# Patient Record
Sex: Female | Born: 1960
Health system: Southern US, Community
[De-identification: ages and names within clinical notes are randomized; demographics above are authoritative.]

## PROBLEM LIST (undated history)

## (undated) DIAGNOSIS — E669 Obesity, unspecified: Secondary | ICD-10-CM

## (undated) DIAGNOSIS — M109 Gout, unspecified: Secondary | ICD-10-CM

## (undated) DIAGNOSIS — H539 Unspecified visual disturbance: Secondary | ICD-10-CM

## (undated) DIAGNOSIS — I499 Cardiac arrhythmia, unspecified: Secondary | ICD-10-CM

## (undated) DIAGNOSIS — G43909 Migraine, unspecified, not intractable, without status migrainosus: Secondary | ICD-10-CM

## (undated) DIAGNOSIS — J309 Allergic rhinitis, unspecified: Secondary | ICD-10-CM

## (undated) DIAGNOSIS — R202 Paresthesia of skin: Secondary | ICD-10-CM

## (undated) DIAGNOSIS — I10 Essential (primary) hypertension: Secondary | ICD-10-CM

## (undated) DIAGNOSIS — N3090 Cystitis, unspecified without hematuria: Secondary | ICD-10-CM

## (undated) DIAGNOSIS — D72819 Decreased white blood cell count, unspecified: Secondary | ICD-10-CM

## (undated) DIAGNOSIS — D649 Anemia, unspecified: Secondary | ICD-10-CM

## (undated) DIAGNOSIS — R200 Anesthesia of skin: Secondary | ICD-10-CM

## (undated) DIAGNOSIS — N920 Excessive and frequent menstruation with regular cycle: Secondary | ICD-10-CM

## (undated) HISTORY — DX: Excessive and frequent menstruation with regular cycle: N92.0

## (undated) HISTORY — DX: Allergic rhinitis, unspecified: J30.9

## (undated) HISTORY — DX: Essential (primary) hypertension: I10

## (undated) HISTORY — PX: ROBOTIC ASSISTED LAPAROSCOPIC VAGINAL HYSTERECTOMY WITH FIBROID REMOVAL: SHX5387

## (undated) HISTORY — DX: Anemia, unspecified: D64.9

## (undated) HISTORY — DX: Anesthesia of skin: R20.0

## (undated) HISTORY — DX: Paresthesia of skin: R20.2

## (undated) HISTORY — PX: TUBAL LIGATION: SHX77

## (undated) HISTORY — DX: Obesity, unspecified: E66.9

## (undated) HISTORY — DX: Unspecified visual disturbance: H53.9

## (undated) HISTORY — PX: CARPAL TUNNEL RELEASE: SHX101

## (undated) HISTORY — DX: Decreased white blood cell count, unspecified: D72.819

## (undated) HISTORY — DX: Cardiac arrhythmia, unspecified: I49.9

## (undated) HISTORY — DX: Migraine, unspecified, not intractable, without status migrainosus: G43.909

## (undated) HISTORY — DX: Gout, unspecified: M10.9

## (undated) HISTORY — DX: Cystitis, unspecified without hematuria: N30.90

---

## 1998-07-23 ENCOUNTER — Emergency Department (HOSPITAL_COMMUNITY): Admission: EM | Admit: 1998-07-23 | Discharge: 1998-07-23 | Payer: Self-pay | Admitting: Emergency Medicine

## 1998-12-05 ENCOUNTER — Other Ambulatory Visit: Admission: RE | Admit: 1998-12-05 | Discharge: 1998-12-05 | Payer: Self-pay | Admitting: *Deleted

## 1999-02-24 ENCOUNTER — Ambulatory Visit (HOSPITAL_COMMUNITY): Admission: RE | Admit: 1999-02-24 | Discharge: 1999-02-24 | Payer: Self-pay | Admitting: *Deleted

## 1999-03-03 ENCOUNTER — Encounter (INDEPENDENT_AMBULATORY_CARE_PROVIDER_SITE_OTHER): Payer: Self-pay | Admitting: *Deleted

## 1999-03-03 ENCOUNTER — Ambulatory Visit (HOSPITAL_COMMUNITY): Admission: RE | Admit: 1999-03-03 | Discharge: 1999-03-03 | Payer: Self-pay | Admitting: *Deleted

## 1999-04-05 ENCOUNTER — Other Ambulatory Visit: Admission: RE | Admit: 1999-04-05 | Discharge: 1999-04-05 | Payer: Self-pay | Admitting: *Deleted

## 2001-01-27 ENCOUNTER — Other Ambulatory Visit: Admission: RE | Admit: 2001-01-27 | Discharge: 2001-01-27 | Payer: Self-pay | Admitting: *Deleted

## 2001-08-01 ENCOUNTER — Emergency Department (HOSPITAL_COMMUNITY): Admission: EM | Admit: 2001-08-01 | Discharge: 2001-08-01 | Payer: Self-pay | Admitting: Emergency Medicine

## 2001-08-01 ENCOUNTER — Encounter: Payer: Self-pay | Admitting: Emergency Medicine

## 2001-08-02 ENCOUNTER — Emergency Department (HOSPITAL_COMMUNITY): Admission: EM | Admit: 2001-08-02 | Discharge: 2001-08-03 | Payer: Self-pay | Admitting: *Deleted

## 2001-08-07 ENCOUNTER — Ambulatory Visit (HOSPITAL_COMMUNITY): Admission: RE | Admit: 2001-08-07 | Discharge: 2001-08-07 | Payer: Self-pay | Admitting: *Deleted

## 2001-11-12 ENCOUNTER — Other Ambulatory Visit: Admission: RE | Admit: 2001-11-12 | Discharge: 2001-11-12 | Payer: Self-pay | Admitting: *Deleted

## 2002-11-27 ENCOUNTER — Other Ambulatory Visit: Admission: RE | Admit: 2002-11-27 | Discharge: 2002-11-27 | Payer: Self-pay | Admitting: Obstetrics & Gynecology

## 2003-12-21 ENCOUNTER — Ambulatory Visit (HOSPITAL_COMMUNITY): Admission: RE | Admit: 2003-12-21 | Discharge: 2003-12-21 | Payer: Self-pay | Admitting: Obstetrics and Gynecology

## 2003-12-21 ENCOUNTER — Other Ambulatory Visit: Admission: RE | Admit: 2003-12-21 | Discharge: 2003-12-21 | Payer: Self-pay | Admitting: Obstetrics & Gynecology

## 2003-12-30 ENCOUNTER — Encounter: Admission: RE | Admit: 2003-12-30 | Discharge: 2003-12-30 | Payer: Self-pay | Admitting: Obstetrics and Gynecology

## 2004-07-07 ENCOUNTER — Encounter: Admission: RE | Admit: 2004-07-07 | Discharge: 2004-07-07 | Payer: Self-pay | Admitting: Emergency Medicine

## 2005-09-26 ENCOUNTER — Inpatient Hospital Stay (HOSPITAL_COMMUNITY): Admission: RE | Admit: 2005-09-26 | Discharge: 2005-09-28 | Payer: Self-pay | Admitting: Obstetrics & Gynecology

## 2005-09-26 ENCOUNTER — Encounter (INDEPENDENT_AMBULATORY_CARE_PROVIDER_SITE_OTHER): Payer: Self-pay | Admitting: Specialist

## 2006-04-10 ENCOUNTER — Ambulatory Visit (HOSPITAL_COMMUNITY): Admission: RE | Admit: 2006-04-10 | Discharge: 2006-04-10 | Payer: Self-pay | Admitting: Obstetrics & Gynecology

## 2007-04-18 ENCOUNTER — Ambulatory Visit (HOSPITAL_COMMUNITY): Admission: RE | Admit: 2007-04-18 | Discharge: 2007-04-18 | Payer: Self-pay | Admitting: Obstetrics & Gynecology

## 2009-07-26 ENCOUNTER — Ambulatory Visit (HOSPITAL_COMMUNITY): Admission: RE | Admit: 2009-07-26 | Discharge: 2009-07-26 | Payer: Self-pay | Admitting: Obstetrics & Gynecology

## 2009-09-29 ENCOUNTER — Encounter: Admission: RE | Admit: 2009-09-29 | Discharge: 2009-09-29 | Payer: Self-pay | Admitting: Family Medicine

## 2010-08-23 ENCOUNTER — Ambulatory Visit (HOSPITAL_COMMUNITY): Admission: RE | Admit: 2010-08-23 | Payer: Self-pay | Source: Home / Self Care | Admitting: Obstetrics & Gynecology

## 2010-09-17 ENCOUNTER — Encounter: Payer: Self-pay | Admitting: Otolaryngology

## 2010-09-17 ENCOUNTER — Encounter: Payer: Self-pay | Admitting: Obstetrics & Gynecology

## 2010-09-18 ENCOUNTER — Other Ambulatory Visit (HOSPITAL_COMMUNITY): Payer: Self-pay | Admitting: Obstetrics & Gynecology

## 2010-09-18 DIAGNOSIS — Z139 Encounter for screening, unspecified: Secondary | ICD-10-CM

## 2010-09-27 ENCOUNTER — Ambulatory Visit (HOSPITAL_COMMUNITY)
Admission: RE | Admit: 2010-09-27 | Discharge: 2010-09-27 | Disposition: A | Discharge status: Home / Self Care | Payer: Self-pay | Source: Ambulatory Visit | Attending: Obstetrics & Gynecology | Admitting: Obstetrics & Gynecology

## 2010-09-27 ENCOUNTER — Ambulatory Visit (HOSPITAL_COMMUNITY)
Admission: RE | Admit: 2010-09-27 | Discharge: 2010-09-27 | Disposition: A | Discharge status: Home / Self Care | Payer: Self-pay | Source: Ambulatory Visit

## 2010-09-27 DIAGNOSIS — Z1231 Encounter for screening mammogram for malignant neoplasm of breast: Secondary | ICD-10-CM

## 2010-09-27 DIAGNOSIS — Z1239 Encounter for other screening for malignant neoplasm of breast: Secondary | ICD-10-CM

## 2010-09-27 DIAGNOSIS — Z139 Encounter for screening, unspecified: Secondary | ICD-10-CM

## 2010-09-28 ENCOUNTER — Ambulatory Visit (HOSPITAL_COMMUNITY): Payer: Self-pay

## 2010-09-28 ENCOUNTER — Other Ambulatory Visit (HOSPITAL_COMMUNITY): Payer: Self-pay | Admitting: Obstetrics & Gynecology

## 2010-09-28 DIAGNOSIS — Z1239 Encounter for other screening for malignant neoplasm of breast: Secondary | ICD-10-CM

## 2010-10-13 ENCOUNTER — Other Ambulatory Visit: Payer: Self-pay | Admitting: Dermatology

## 2010-10-27 ENCOUNTER — Other Ambulatory Visit: Payer: Self-pay | Admitting: Gastroenterology

## 2011-01-12 NOTE — Op Note (Signed)
Danielle Griffin, ELEM               ACCOUNT NO.:  [id number]   MEDICAL RECORD NO.:  [id number]          PATIENT TYPE:  INP   LOCATION:  9399                          FACILITY:  [facility]   PHYSICIAN:  Genia Del, M.D.DATE OF BIRTH:  05-04-1961   DATE OF PROCEDURE:  09/26/2005  DATE OF DISCHARGE:                                 OPERATIVE REPORT   PREOPERATIVE DIAGNOSIS:  Symptomatic uterine myomas.   POSTOPERATIVE DIAGNOSES:  Symptomatic uterine myomas.   INTERVENTION:  Multiple myomectomies.   SURGEON:  Genia Del, M.D.   ASSISTANT:  Lenoard Aden, M.D.   ANESTHESIA:  Dr. Jean Rosenthal.   PROCEDURE:  Under general anesthesia with endotracheal intubation, the  patient is in decubitus dorsal position.  She was prepped with Betadine on  the abdominal, suprapubic, vulvar and vaginal areas.  A bladder catheter is  inserted and the patient was draped as usual.  A Pfannenstiel incision is  done with the scalpel after infiltrating the subcutaneous tissue with  Marcaine 0.25% 10 mL.  We then open the adipose tissue with the  electrocautery and the aponeurosis with the electrocautery and Mayo  scissors. The recti muscles are separated on the midline from the  aponeurosis.  The parietal peritoneum is opened longitudinally with  Metzenbaum scissors.  We exteriorize the uterus at the skin.  It is  voluminous with multiple uterine myomas.  Some are subserosal, others are  intramural.  They are throughout the uterus anteriorly and posteriorly.  We  inspect the adnexa.  Both ovaries are normal in size and appearance, and  both tubes are normal except for a previous bilateral tubal sterilization.  We infiltrate the myometrium with Pitressin 20 in 60.  We put 10 mL  anteriorly and 10 mL posteriorly.  We start with an anterior longitudinal  incision with the electrocautery cutting mode and coagulation when  hemostasis is needed.  We remove many intramural fibroids with that  incision.  We  remove them using Metzenbaum scissors and electrocautery.  We  proceed the same way with a longitudinal posterior incision.  The total  uterine myomas removed is 36.  Those will also be weighed.  We then proceed  with closure of both incisions.  We use a Vicryl 0 in pursestring and X-  stitches to close the deep layer of the myometrium.  We then use a running  baseball stitch to close the serosa.  We proceed the same way with the  posterior incision.  We complete hemostasis at the serosa, where subserosal  fibroids were removed.  We put Interceed on both longitudinal incisions.  Hemostasis is adequate at all levels.  Note that the baseball stitches were  done with 2-0 Vicryl.  We suction the abdominal-pelvic cavities.  We verify  hemostasis on the recti abdominal muscles and complete it with the  electrocautery.  We then close the aponeurosis with two half  running sutures of Vicryl 0, complete hemostasis on the adipose tissue with  the electrocautery and close the skin with staples.  A dry dressing is  applied.  The estimated blood loss was 600 mL,  no complication occurred, and  the patient was brought to recovery room in good status.      Genia Del, M.D.  Electronically Signed     ML/MEDQ  D:  09/26/2005  T:  09/26/2005  Job:  [id number]

## 2011-01-12 NOTE — Discharge Summary (Signed)
Danielle Griffin, Danielle Griffin               ACCOUNT NO.:  [id number]   MEDICAL RECORD NO.:  [id number]          PATIENT TYPE:  INP   LOCATION:  9306                          FACILITY:  [facility]   PHYSICIAN:  Danielle Griffin, M.D.DATE OF BIRTH:  Apr 27, 1961   DATE OF ADMISSION:  09/26/2005  DATE OF DISCHARGE:  09/28/2005                                 DISCHARGE SUMMARY   ADMISSION DIAGNOSIS:  Symptomatic uterine myoma.   DISCHARGE DIAGNOSES:  1.  Symptomatic uterine myoma.  2.  Severe postop anemia.   INTERVENTION:  Myomectomy by laparotomy.   SURGEON:  Dr. Genia Griffin.   ASSISTANT:  Dr. Ocie Cornfield.   ANESTHESIOLOGIST:  Dr. Algie Coffer.   HOSPITAL COURSE:  The patient was admitted on the day of surgery.  The  surgery was uneventful.  The estimated blood loss was 600 mL.  Multiple  myomas were removed, about 34 of them.  Those were intramural and subserosal  myomas.  The postop evolution was good.  The patient remained  hemodynamically stable.  However, she lowered her hemoglobin to 6.2 and then  remained stable at that level.  Her last hemoglobin before discharge was  6.3.  She was discharged in stable status.  She was prescribed Chromagen  forte.  She was also advised a high-iron diet and she will be followed up at  the office with a hemoglobin and removal of the staples on February 5.  Postop advice was given.      Danielle Griffin, M.D.  Electronically Signed     ML/MEDQ  D:  10/18/2005  T:  10/18/2005  Job:  [id number]

## 2012-02-01 ENCOUNTER — Other Ambulatory Visit: Payer: Self-pay | Admitting: Gastroenterology

## 2013-10-01 ENCOUNTER — Encounter: Payer: Self-pay | Admitting: Cardiology

## 2013-10-01 DIAGNOSIS — I10 Essential (primary) hypertension: Secondary | ICD-10-CM

## 2013-10-01 DIAGNOSIS — J309 Allergic rhinitis, unspecified: Secondary | ICD-10-CM | POA: Insufficient documentation

## 2018-10-09 ENCOUNTER — Encounter: Payer: Self-pay | Admitting: *Deleted

## 2018-10-13 ENCOUNTER — Encounter: Payer: Self-pay | Admitting: Diagnostic Neuroimaging

## 2018-10-13 ENCOUNTER — Ambulatory Visit (INDEPENDENT_AMBULATORY_CARE_PROVIDER_SITE_OTHER): Payer: BLUE CROSS/BLUE SHIELD | Admitting: Diagnostic Neuroimaging

## 2018-10-13 ENCOUNTER — Other Ambulatory Visit: Payer: Self-pay

## 2018-10-13 VITALS — BP 117/73 | HR 100 | Resp 18 | Ht 65.0 in | Wt 226.0 lb

## 2018-10-13 DIAGNOSIS — I7300 Raynaud's syndrome without gangrene: Secondary | ICD-10-CM

## 2018-10-13 NOTE — Progress Notes (Signed)
[facility]  PATIENT: Danielle Griffin DOB: Oct 18, 1960  REFERRING CLINICIAN: D Gates HISTORY FROM: patient  REASON FOR VISIT: new consult    HISTORICAL  CHIEF COMPLAINT:  Chief Complaint  Patient presents with  . Numbness    Rm. 7. Here alone for eval of throbbing pain, numbness, color change in all fingers, toes, in the late morning, afternoons only, around 10am-2pm. First noted about 6 mos. ago./fim    HISTORY OF PRESENT ILLNESS:   58 year old female here for leg numbness and tingling.  For past 6 months patient has intermittent color change in her fingertips, which turn white, followed by throbbing numbness pain sensation.  She tries to warm up her hands by rubbing them together, wearing gloves or other techniques which slightly helps.  Symptoms are worse between 10 AM and 2 PM when she is at work in a heavily air-conditioned area.  Symptoms are worse in the wintertime.  Symptoms can occur in warmer weather but are less severe.  Symptoms also affect her toes.  Currently patient does not have symptoms in our office.  No facial numbness or pain.  No gait or balance difficulty.   REVIEW OF SYSTEMS: Full 14 system review of systems performed and negative with exception of: Fevers chills weight gain swelling in legs snoring joint pain.   ALLERGIES: Allergies  Allergen Reactions  . Norvasc [Amlodipine Besylate] Other (See Comments)    drowsiness  . Penicillins     Nausea   . Tylox [Oxycodone-Acetaminophen]     Migraines    HOME MEDICATIONS: Outpatient Medications Prior to Visit  Medication Sig Dispense Refill  . albuterol (PROVENTIL HFA;VENTOLIN HFA) 108 (90 BASE) MCG/ACT inhaler Inhale 2 puffs into the lungs every 4 (four) hours as needed for wheezing or shortness of breath.    . allopurinol (ZYLOPRIM) 300 MG tablet Take 300 mg by mouth daily.    . cetirizine (ZYRTEC) 10 MG chewable tablet Chew 10 mg by mouth daily.    . cyclobenzaprine (FLEXERIL)  10 MG tablet Take 10 mg by mouth 3 (three) times daily as needed.    . fluticasone (FLONASE) 50 MCG/ACT nasal spray Place 2 sprays into both nostrils daily.    . hydrochlorothiazide (HYDRODIURIL) 25 MG tablet Take 25 mg by mouth daily.    . meloxicam (MOBIC) 15 MG tablet Take 15 mg by mouth daily. As needed    . montelukast (SINGULAIR) 10 MG tablet Take 10 mg by mouth at bedtime.    . Multiple Vitamin (MULTIVITAMIN) tablet Take 1 tablet by mouth daily.    Marland Kitchen olmesartan (BENICAR) 40 MG tablet Take 40 mg by mouth daily.     No facility-administered medications prior to visit.     PAST MEDICAL HISTORY: Past Medical History:  Diagnosis Date  . Abnormal heart rhythm   . Allergic rhinitis   . Anemia   . Cystitis   . Gout   . Hypertension   . Leukopenia   . Menorrhagia   . Migraine   . Numbness and tingling   . Obesity   . Vision abnormalities     PAST SURGICAL HISTORY: Past Surgical History:  Procedure Laterality Date  . CARPAL TUNNEL RELEASE    . ROBOTIC ASSISTED LAPAROSCOPIC VAGINAL HYSTERECTOMY WITH FIBROID REMOVAL    . TUBAL LIGATION      FAMILY HISTORY: Family History  Problem Relation Age of Onset  . Hypertension Mother   . Hyperlipidemia Mother   . Dementia Mother   . Esophageal  cancer Father   . Hyperlipidemia Sister   . Hypertension Sister   . Hypertension Brother   . Hypertension Brother   . Colon cancer Maternal Aunt   . Heart attack Maternal Grandfather     SOCIAL HISTORY: Social History   Socioeconomic History  . Marital status: Married    Spouse name: Jenaya Saar  . Number of children: 2  . Years of education: 15 yrs.   . Highest education level: High school graduate  Occupational History  . Occupation: Merchant navy officer    Comment: [employer]  Social Needs  . Financial resource strain: Not on file  . Food insecurity:    Worry: Not on file    Inability: Not on file  . Transportation needs:    Medical: Not on file     Non-medical: Not on file  Tobacco Use  . Smoking status: Never Smoker  . Smokeless tobacco: Never Used  Substance and Sexual Activity  . Alcohol use: Yes  . Drug use: No  . Sexual activity: Not on file  Lifestyle  . Physical activity:    Days per week: Not on file    Minutes per session: Not on file  . Stress: Not on file  Relationships  . Social connections:    Talks on phone: Not on file    Gets together: Not on file    Attends religious service: Not on file    Active member of club or organization: Not on file    Attends meetings of clubs or organizations: Not on file    Relationship status: Not on file  . Intimate partner violence:    Fear of current or ex partner: Not on file    Emotionally abused: Not on file    Physically abused: Not on file    Forced sexual activity: Not on file  Other Topics Concern  . Not on file  Social History Narrative  . Not on file     PHYSICAL EXAM  GENERAL EXAM/CONSTITUTIONAL: Vitals:  Vitals:   10/13/18 1033  BP: 117/73  Pulse: 100  Resp: 18  Weight: 226 lb (102.5 kg)  Height: 5\' 5"  (1.651 m)     Body mass index is 37.61 kg/m. Wt Readings from Last 3 Encounters:  10/13/18 226 lb (102.5 kg)     Patient is in no distress; well developed, nourished and groomed; neck is supple  CARDIOVASCULAR:  Examination of carotid arteries is normal; no carotid bruits  Regular rate and rhythm, no murmurs  Examination of peripheral vascular system by observation and palpation is normal  EYES:  Ophthalmoscopic exam of optic discs and posterior segments is normal; no papilledema or hemorrhages  No exam data present  MUSCULOSKELETAL:  Gait, strength, tone, movements noted in Neurologic exam below  NEUROLOGIC: MENTAL STATUS:  No flowsheet data found.  awake, alert, oriented to person, place and time  recent and remote memory intact  normal attention and concentration  language fluent, comprehension intact, naming  intact  fund of knowledge appropriate  CRANIAL NERVE:   2nd - no papilledema on fundoscopic exam  2nd, 3rd, 4th, 6th - pupils equal and reactive to light, visual fields full to confrontation, extraocular muscles intact, no nystagmus  5th - facial sensation symmetric  7th - facial strength symmetric  8th - hearing intact  9th - palate elevates symmetrically, uvula midline  11th - shoulder shrug symmetric  12th - tongue protrusion midline  MOTOR:   normal bulk and tone, full strength in the  BUE, BLE  SENSORY:   normal and symmetric to light touch, temperature, vibration  COORDINATION:   finger-nose-finger, fine finger movements normal  REFLEXES:   deep tendon reflexes TRACE and symmetric  GAIT/STATION:   narrow based gait     DIAGNOSTIC DATA (LABS, IMAGING, TESTING) - I reviewed patient records, labs, notes, testing and imaging myself where available.  No results found for: WBC, HGB, HCT, MCV, PLT No results found for: NA, K, CL, CO2, GLUCOSE, BUN, CREATININE, CALCIUM, PROT, ALBUMIN, AST, ALT, ALKPHOS, BILITOT, GFRNONAA, GFRAA No results found for: CHOL, HDL, LDLCALC, LDLDIRECT, TRIG, CHOLHDL No results found for: HGBA1C No results found for: VITAMINB12 No results found for: TSH  07/12/04 MRI brain [I reviewed images myself and agree with interpretation. -[facility]]  - Negative MRI of the brain without and with contrast.   ASSESSMENT AND PLAN  58 y.o. year old female here with cold sensitivity, color change in fingertips, numbness and pain in the fingertips, most consistent with Raynaud's phenomenon.  Unclear whether this is primary or secondary.  Recommend follow-up PCP and rheumatology for further evaluation.  No evidence of neuropathy at this time.  Dx:  1. Raynaud's phenomenon without gangrene     PLAN:  RAYNAUD'S PHENOMENON (painful, cold sensitive, color change to white) - avoid cold exposure - consider rheumatology evaluation (rule out secondary  causes) - consider calcium channel blockers (amlodipine or nifedipine)  Return for return to PCP, pending if symptoms worsen or fail to improve.    Penni Bombard, MD 4/46/1901, 22:24 AM Certified in Neurology, Neurophysiology and Neuroimaging  [facility] [address], [place], [place] [address] [phone]

## 2018-10-13 NOTE — Patient Instructions (Signed)
  RAYNAUD'S PHENOMENON (painful, cold sensitive, color change to white) - avoid cold exposure - consider rheumatology evaluation (rule out secondary causes) - consider calcium channel blockers (amlodipine or nifedipine)

## 2018-12-23 ENCOUNTER — Encounter: Payer: Self-pay | Admitting: Emergency Medicine

## 2018-12-23 ENCOUNTER — Other Ambulatory Visit: Payer: Self-pay

## 2018-12-23 ENCOUNTER — Ambulatory Visit
Admission: EM | Admit: 2018-12-23 | Discharge: 2018-12-23 | Disposition: A | Discharge status: Home / Self Care | Payer: BLUE CROSS/BLUE SHIELD | Attending: Physician Assistant | Admitting: Physician Assistant

## 2018-12-23 DIAGNOSIS — I10 Essential (primary) hypertension: Secondary | ICD-10-CM

## 2018-12-23 DIAGNOSIS — R1084 Generalized abdominal pain: Secondary | ICD-10-CM | POA: Diagnosis not present

## 2018-12-23 DIAGNOSIS — R112 Nausea with vomiting, unspecified: Secondary | ICD-10-CM

## 2018-12-23 MED ORDER — DICYCLOMINE HCL 20 MG PO TABS: 20.0000 mg | ORAL_TABLET | Freq: Two times a day (BID) | ORAL | 0 refills | Status: AC

## 2018-12-23 MED ORDER — ONDANSETRON 4 MG PO TBDP: 4.0000 mg | ORAL_TABLET | Freq: Three times a day (TID) | ORAL | 0 refills | Status: AC | PRN

## 2018-12-23 MED ORDER — ONDANSETRON 4 MG PO TBDP
4.0000 mg | ORAL_TABLET | Freq: Once | ORAL | Status: AC
  Administered 2018-12-23: 4 mg via ORAL

## 2018-12-23 NOTE — Discharge Instructions (Signed)
Zofran for nausea and vomiting as needed. Bentyl for abdominal cramping. Keep hydrated, you urine should be clear to pale yellow in color. Bland diet, advance as tolerated. Monitor for any worsening of symptoms, nausea or vomiting not controlled by medication, worsening abdominal pain, fever, follow-up for reevaluation. °

## 2018-12-23 NOTE — ED Notes (Signed)
Patient able to ambulate independently  

## 2018-12-23 NOTE — ED Provider Notes (Signed)
[name]    CSN: [id number] Arrival date & time: 12/23/18  1622     History   Chief Complaint Chief Complaint  Patient presents with  . Abdominal Pain    HPI LOLLY GLAUS is a 58 y.o. female.   58 year old female with history of HTN, comes in for 2-day history of nausea, vomiting, diarrhea.  States she ate out last night, and had diarrhea shortly after.  She has since then not had a bowel movement.  Woke up this morning, and has had nausea with 10 episodes of nonbloody emesis.  Has not been able to tolerate oral intake.  She has generalized abdominal pain that is constant, waxes and wanes in intensity.  Describes it as stabbing/cramping pain. Has still been passing flatus. Denies fever, chills, night sweats.  Denies urinary symptoms such as cough, congestion, sore throat.  Has not taken anything for the symptoms. History of vaginal hysterectomy, tubal ligation.      Past Medical History:  Diagnosis Date  . Abnormal heart rhythm   . Allergic rhinitis   . Anemia   . Cystitis   . Gout   . Hypertension   . Leukopenia   . Menorrhagia   . Migraine   . Numbness and tingling   . Obesity   . Vision abnormalities     Patient Active Problem List   Diagnosis Date Noted  . Unspecified essential hypertension 10/01/2013  . Allergic rhinitis, cause unspecified 10/01/2013    Past Surgical History:  Procedure Laterality Date  . CARPAL TUNNEL RELEASE    . ROBOTIC ASSISTED LAPAROSCOPIC VAGINAL HYSTERECTOMY WITH FIBROID REMOVAL    . TUBAL LIGATION      OB History   No obstetric history on file.      Home Medications    Prior to Admission medications   Medication Sig Start Date End Date Taking? Authorizing Provider  albuterol (PROVENTIL HFA;VENTOLIN HFA) 108 (90 BASE) MCG/ACT inhaler Inhale 2 puffs into the lungs every 4 (four) hours as needed for wheezing or shortness of breath.    [provider]  allopurinol (ZYLOPRIM) 300 MG tablet Take 300 mg  by mouth daily.    [provider]  cetirizine (ZYRTEC) 10 MG chewable tablet Chew 10 mg by mouth daily.    [provider]  cyclobenzaprine (FLEXERIL) 10 MG tablet Take 10 mg by mouth 3 (three) times daily as needed.    [provider]  dicyclomine (BENTYL) 20 MG tablet Take 1 tablet (20 mg total) by mouth 2 (two) times daily. 12/23/18   Tasia Catchings, Amy V, PA-C  fluticasone (FLONASE) 50 MCG/ACT nasal spray Place 2 sprays into both nostrils daily.    [provider]  hydrochlorothiazide (HYDRODIURIL) 25 MG tablet Take 25 mg by mouth daily.    [provider]  meloxicam (MOBIC) 15 MG tablet Take 15 mg by mouth daily. As needed    [provider]  montelukast (SINGULAIR) 10 MG tablet Take 10 mg by mouth at bedtime.    [provider]  Multiple Vitamin (MULTIVITAMIN) tablet Take 1 tablet by mouth daily.    [provider]  olmesartan (BENICAR) 40 MG tablet Take 40 mg by mouth daily.    [provider]  ondansetron (ZOFRAN ODT) 4 MG disintegrating tablet Take 1 tablet (4 mg total) by mouth every 8 (eight) hours as needed for nausea or vomiting. 12/23/18   Ok Edwards, PA-C    Family History Family History  Problem Relation  Age of Onset  . Hypertension Mother   . Hyperlipidemia Mother   . Dementia Mother   . Esophageal cancer Father   . Hyperlipidemia Sister   . Hypertension Sister   . Hypertension Brother   . Hypertension Brother   . Colon cancer Maternal Aunt   . Heart attack Maternal Grandfather     Social History Social History   Tobacco Use  . Smoking status: Never Smoker  . Smokeless tobacco: Never Used  Substance Use Topics  . Alcohol use: Yes  . Drug use: No     Allergies   Norvasc [amlodipine besylate]; Penicillins; and Tylox [oxycodone-acetaminophen]   Review of Systems Review of Systems  Reason unable to perform ROS: See HPI as above.     Physical Exam Triage Vital Signs ED Triage Vitals  [12/23/18 1632]  Enc Vitals Group     BP (!) 167/98     Pulse Rate 88     Resp 18     Temp 98.1 F (36.7 C)     Temp Source Oral     SpO2 98 %     Weight      Height      Head Circumference      Peak Flow      Pain Score 6     Pain Loc      Pain Edu?      Excl. in [place]?    No data found.  Updated Vital Signs BP (!) 167/98 (BP Location: Left Arm)   Pulse 88   Temp 98.1 F (36.7 C) (Oral)   Resp 18   SpO2 98%   Physical Exam Constitutional:      General: She is not in acute distress.    Appearance: She is well-developed. She is not ill-appearing, toxic-appearing or diaphoretic.  HENT:     Head: Normocephalic and atraumatic.  Eyes:     Conjunctiva/sclera: Conjunctivae normal.     Pupils: Pupils are equal, round, and reactive to light.  Cardiovascular:     Rate and Rhythm: Normal rate and regular rhythm.     Heart sounds: Normal heart sounds. No murmur. No friction rub. No gallop.   Pulmonary:     Effort: Pulmonary effort is normal.     Breath sounds: Normal breath sounds. No wheezing or rales.  Abdominal:     General: Bowel sounds are normal.     Palpations: Abdomen is soft.     Tenderness: There is no right CVA tenderness or left CVA tenderness.     Comments: Generalized abdominal pain on palpation.  No guarding, rebound.  Skin:    General: Skin is warm and dry.  Neurological:     Mental Status: She is alert and oriented to person, place, and time.  Psychiatric:        Behavior: Behavior normal.        Judgment: Judgment normal.      UC Treatments / Results  Labs (all labs ordered are listed, but only abnormal results are displayed) Labs Reviewed - No data to display  EKG None  Radiology No results found.  Procedures Procedures (including critical care time)  Medications Ordered in UC Medications  ondansetron (ZOFRAN-ODT) disintegrating tablet 4 mg (4 mg Oral Given 12/23/18 1635)    Initial Impression / Assessment and Plan / UC Course  I have  reviewed the triage vital signs and the nursing notes.  Pertinent labs & imaging results that were available during my care of the patient  were reviewed by me and considered in my medical decision making (see chart for details).    Discussed with patient no alarming signs on exam. Patient able to tolerate fluids after zofran in office. Zofran for nausea. Bentyl for abdominal cramping. Push fluids. Bland diet, advance as tolerated. Return precautions given.  Final Clinical Impressions(s) / UC Diagnoses   Final diagnoses:  Intractable vomiting with nausea, unspecified vomiting type  Generalized abdominal pain   ED Prescriptions    Medication Sig Dispense Auth. Provider   dicyclomine (BENTYL) 20 MG tablet Take 1 tablet (20 mg total) by mouth 2 (two) times daily. 20 tablet Yu, Amy V, PA-C   ondansetron (ZOFRAN ODT) 4 MG disintegrating tablet Take 1 tablet (4 mg total) by mouth every 8 (eight) hours as needed for nausea or vomiting. 20 tablet Tobin Chad, [place] 12/23/18 1734

## 2018-12-23 NOTE — ED Triage Notes (Signed)
Pt presents to [facility] for assessment of diarrhea starting last night.  Then early this morning she began to have abdominal pain, all across the bottom of her abdomen, and nausea with about 10 episodes of emesis.

## 2018-12-25 ENCOUNTER — Observation Stay (HOSPITAL_COMMUNITY): Payer: BLUE CROSS/BLUE SHIELD

## 2018-12-25 ENCOUNTER — Inpatient Hospital Stay (HOSPITAL_COMMUNITY)
Admission: EM | Admit: 2018-12-25 | Discharge: 2019-01-06 | DRG: 336 | Disposition: A | Discharge status: Home / Self Care | Payer: BLUE CROSS/BLUE SHIELD | Attending: Internal Medicine | Admitting: Internal Medicine

## 2018-12-25 ENCOUNTER — Encounter (HOSPITAL_COMMUNITY): Payer: Self-pay

## 2018-12-25 ENCOUNTER — Other Ambulatory Visit: Payer: Self-pay

## 2018-12-25 DIAGNOSIS — R112 Nausea with vomiting, unspecified: Secondary | ICD-10-CM

## 2018-12-25 DIAGNOSIS — R140 Abdominal distension (gaseous): Secondary | ICD-10-CM | POA: Diagnosis not present

## 2018-12-25 DIAGNOSIS — Z1612 Extended spectrum beta lactamase (ESBL) resistance: Secondary | ICD-10-CM | POA: Diagnosis not present

## 2018-12-25 DIAGNOSIS — I10 Essential (primary) hypertension: Secondary | ICD-10-CM | POA: Diagnosis not present

## 2018-12-25 DIAGNOSIS — D649 Anemia, unspecified: Secondary | ICD-10-CM

## 2018-12-25 DIAGNOSIS — R1010 Upper abdominal pain, unspecified: Secondary | ICD-10-CM

## 2018-12-25 DIAGNOSIS — K9189 Other postprocedural complications and disorders of digestive system: Secondary | ICD-10-CM | POA: Diagnosis not present

## 2018-12-25 DIAGNOSIS — Z6836 Body mass index (BMI) 36.0-36.9, adult: Secondary | ICD-10-CM

## 2018-12-25 DIAGNOSIS — R945 Abnormal results of liver function studies: Secondary | ICD-10-CM | POA: Diagnosis present

## 2018-12-25 DIAGNOSIS — K567 Ileus, unspecified: Secondary | ICD-10-CM | POA: Diagnosis not present

## 2018-12-25 DIAGNOSIS — K5669 Other partial intestinal obstruction: Secondary | ICD-10-CM | POA: Diagnosis not present

## 2018-12-25 DIAGNOSIS — E860 Dehydration: Secondary | ICD-10-CM | POA: Diagnosis present

## 2018-12-25 DIAGNOSIS — Z4682 Encounter for fitting and adjustment of non-vascular catheter: Secondary | ICD-10-CM | POA: Diagnosis not present

## 2018-12-25 DIAGNOSIS — R17 Unspecified jaundice: Secondary | ICD-10-CM | POA: Diagnosis present

## 2018-12-25 DIAGNOSIS — Z8249 Family history of ischemic heart disease and other diseases of the circulatory system: Secondary | ICD-10-CM

## 2018-12-25 DIAGNOSIS — B9620 Unspecified Escherichia coli [E. coli] as the cause of diseases classified elsewhere: Secondary | ICD-10-CM | POA: Diagnosis not present

## 2018-12-25 DIAGNOSIS — E669 Obesity, unspecified: Secondary | ICD-10-CM | POA: Diagnosis not present

## 2018-12-25 DIAGNOSIS — R109 Unspecified abdominal pain: Secondary | ICD-10-CM | POA: Diagnosis present

## 2018-12-25 DIAGNOSIS — M109 Gout, unspecified: Secondary | ICD-10-CM | POA: Diagnosis present

## 2018-12-25 DIAGNOSIS — Z0189 Encounter for other specified special examinations: Secondary | ICD-10-CM

## 2018-12-25 DIAGNOSIS — K565 Intestinal adhesions [bands], unspecified as to partial versus complete obstruction: Secondary | ICD-10-CM | POA: Diagnosis not present

## 2018-12-25 DIAGNOSIS — E876 Hypokalemia: Secondary | ICD-10-CM

## 2018-12-25 DIAGNOSIS — Z8349 Family history of other endocrine, nutritional and metabolic diseases: Secondary | ICD-10-CM | POA: Diagnosis not present

## 2018-12-25 DIAGNOSIS — D259 Leiomyoma of uterus, unspecified: Secondary | ICD-10-CM | POA: Diagnosis present

## 2018-12-25 DIAGNOSIS — N179 Acute kidney failure, unspecified: Secondary | ICD-10-CM | POA: Diagnosis not present

## 2018-12-25 DIAGNOSIS — R197 Diarrhea, unspecified: Principal | ICD-10-CM

## 2018-12-25 DIAGNOSIS — Z978 Presence of other specified devices: Secondary | ICD-10-CM

## 2018-12-25 DIAGNOSIS — K56609 Unspecified intestinal obstruction, unspecified as to partial versus complete obstruction: Secondary | ICD-10-CM | POA: Diagnosis not present

## 2018-12-25 DIAGNOSIS — Z800 Family history of malignant neoplasm of digestive organs: Secondary | ICD-10-CM

## 2018-12-25 DIAGNOSIS — N390 Urinary tract infection, site not specified: Secondary | ICD-10-CM | POA: Diagnosis not present

## 2018-12-25 DIAGNOSIS — R7989 Other specified abnormal findings of blood chemistry: Secondary | ICD-10-CM

## 2018-12-25 DIAGNOSIS — K219 Gastro-esophageal reflux disease without esophagitis: Secondary | ICD-10-CM | POA: Diagnosis not present

## 2018-12-25 DIAGNOSIS — K5651 Intestinal adhesions [bands], with partial obstruction: Secondary | ICD-10-CM | POA: Diagnosis not present

## 2018-12-25 DIAGNOSIS — R739 Hyperglycemia, unspecified: Secondary | ICD-10-CM | POA: Diagnosis not present

## 2018-12-25 DIAGNOSIS — R188 Other ascites: Secondary | ICD-10-CM | POA: Diagnosis not present

## 2018-12-25 DIAGNOSIS — R1084 Generalized abdominal pain: Secondary | ICD-10-CM | POA: Diagnosis not present

## 2018-12-25 DIAGNOSIS — R3989 Other symptoms and signs involving the genitourinary system: Secondary | ICD-10-CM | POA: Diagnosis not present

## 2018-12-25 LAB — CBC WITH DIFFERENTIAL/PLATELET
Abs Immature Granulocytes: 0.06 10*3/uL (ref 0.00–0.07)
Basophils Absolute: 0 10*3/uL (ref 0.0–0.1)
Basophils Relative: 0 %
Eosinophils Absolute: 0 10*3/uL (ref 0.0–0.5)
Eosinophils Relative: 0 %
HCT: 46.5 % — ABNORMAL HIGH (ref 36.0–46.0)
Hemoglobin: 14.6 g/dL (ref 12.0–15.0)
Immature Granulocytes: 1 %
Lymphocytes Relative: 19 %
Lymphs Abs: 1 10*3/uL (ref 0.7–4.0)
MCH: 30 pg (ref 26.0–34.0)
MCHC: 31.4 g/dL (ref 30.0–36.0)
MCV: 95.7 fL (ref 80.0–100.0)
Monocytes Absolute: 0.6 10*3/uL (ref 0.1–1.0)
Monocytes Relative: 11 %
Neutro Abs: 3.8 10*3/uL (ref 1.7–7.7)
Neutrophils Relative %: 69 %
Platelets: 278 10*3/uL (ref 150–400)
RBC: 4.86 MIL/uL (ref 3.87–5.11)
RDW: 13.8 % (ref 11.5–15.5)
WBC: 5.6 10*3/uL (ref 4.0–10.5)
nRBC: 0 % (ref 0.0–0.2)

## 2018-12-25 LAB — COMPREHENSIVE METABOLIC PANEL
ALT: 17 U/L (ref 0–44)
AST: 25 U/L (ref 15–41)
Albumin: 4.6 g/dL (ref 3.5–5.0)
Alkaline Phosphatase: 66 U/L (ref 38–126)
Anion gap: 13 (ref 5–15)
BUN: 37 mg/dL — ABNORMAL HIGH (ref 6–20)
CO2: 26 mmol/L (ref 22–32)
Calcium: 9.2 mg/dL (ref 8.9–10.3)
Chloride: 100 mmol/L (ref 98–111)
Creatinine, Ser: 2.4 mg/dL — ABNORMAL HIGH (ref 0.44–1.00)
GFR calc Af Amer: 25 mL/min — ABNORMAL LOW (ref >60)
GFR calc non Af Amer: 21 mL/min — ABNORMAL LOW (ref >60)
Glucose, Bld: 146 mg/dL — ABNORMAL HIGH (ref 70–99)
Potassium: 3.6 mmol/L (ref 3.5–5.1)
Sodium: 139 mmol/L (ref 135–145)
Total Bilirubin: 1 mg/dL (ref 0.3–1.2)
Total Protein: 8.2 g/dL — ABNORMAL HIGH (ref 6.5–8.1)

## 2018-12-25 LAB — LIPASE, BLOOD: Lipase: 26 U/L (ref 11–51)

## 2018-12-25 MED ORDER — TRAMADOL HCL 50 MG PO TABS: 50.0000 mg | ORAL_TABLET | Freq: Two times a day (BID) | ORAL | Status: DC | PRN

## 2018-12-25 MED ORDER — ACETAMINOPHEN 325 MG PO TABS: 650.0000 mg | ORAL_TABLET | Freq: Four times a day (QID) | ORAL | Status: DC | PRN

## 2018-12-25 MED ORDER — HYDRALAZINE HCL 20 MG/ML IJ SOLN: 10.0000 mg | INTRAMUSCULAR | Status: DC | PRN

## 2018-12-25 MED ORDER — PROMETHAZINE HCL 25 MG/ML IJ SOLN
25.0000 mg | Freq: Four times a day (QID) | INTRAMUSCULAR | Status: AC | PRN
  Administered 2018-12-25: 25 mg via INTRAVENOUS
  Filled 2018-12-25: qty 1

## 2018-12-25 MED ORDER — ONDANSETRON HCL 4 MG/2ML IJ SOLN: 4.0000 mg | Freq: Four times a day (QID) | INTRAMUSCULAR | Status: DC | PRN

## 2018-12-25 MED ORDER — SENNOSIDES-DOCUSATE SODIUM 8.6-50 MG PO TABS: 1.0000 | ORAL_TABLET | Freq: Every evening | ORAL | Status: DC | PRN

## 2018-12-25 MED ORDER — SODIUM CHLORIDE 0.9 % IV BOLUS
1000.0000 mL | Freq: Once | INTRAVENOUS | Status: AC
  Administered 2018-12-25: 13:00:00 1000 mL via INTRAVENOUS

## 2018-12-25 MED ORDER — PROMETHAZINE HCL 25 MG/ML IJ SOLN
25.0000 mg | Freq: Once | INTRAMUSCULAR | Status: AC
  Administered 2018-12-25: 08:00:00 25 mg via INTRAVENOUS
  Filled 2018-12-25: qty 1

## 2018-12-25 MED ORDER — POLYETHYLENE GLYCOL 3350 17 G PO PACK: 17.0000 g | PACK | Freq: Every day | ORAL | Status: DC | PRN

## 2018-12-25 MED ORDER — LIP MEDEX EX OINT
TOPICAL_OINTMENT | CUTANEOUS | Status: AC
  Administered 2018-12-25: 17:00:00
  Filled 2018-12-25: qty 7

## 2018-12-25 MED ORDER — ONDANSETRON HCL 4 MG PO TABS: 4.0000 mg | ORAL_TABLET | Freq: Four times a day (QID) | ORAL | Status: DC | PRN

## 2018-12-25 MED ORDER — SODIUM CHLORIDE 0.9 % IV BOLUS (SEPSIS)
1000.0000 mL | Freq: Once | INTRAVENOUS | Status: AC
  Administered 2018-12-25: 09:00:00 1000 mL via INTRAVENOUS

## 2018-12-25 MED ORDER — POTASSIUM CHLORIDE 10 MEQ/100ML IV SOLN
10.0000 meq | INTRAVENOUS | Status: AC
  Administered 2018-12-25 (×4): 10 meq via INTRAVENOUS
  Filled 2018-12-25: qty 100

## 2018-12-25 MED ORDER — DEXTROSE-NACL 5-0.45 % IV SOLN
INTRAVENOUS | Status: AC
  Administered 2018-12-25 – 2018-12-26 (×2): via INTRAVENOUS

## 2018-12-25 MED ORDER — SODIUM CHLORIDE 0.9 % IV SOLN: 1000.0000 mL | INTRAVENOUS | Status: DC

## 2018-12-25 MED ORDER — SENNOSIDES-DOCUSATE SODIUM 8.6-50 MG PO TABS: 2.0000 | ORAL_TABLET | Freq: Every evening | ORAL | Status: DC | PRN

## 2018-12-25 MED ORDER — ACETAMINOPHEN 650 MG RE SUPP: 650.0000 mg | Freq: Four times a day (QID) | RECTAL | Status: DC | PRN

## 2018-12-25 MED ORDER — SODIUM CHLORIDE 0.9 % IV BOLUS
1000.0000 mL | Freq: Once | INTRAVENOUS | Status: AC
  Administered 2018-12-25: 08:00:00 1000 mL via INTRAVENOUS

## 2018-12-25 MED ORDER — IOHEXOL 300 MG/ML  SOLN
30.0000 mL | Freq: Once | INTRAMUSCULAR | Status: AC | PRN
  Administered 2018-12-25: 30 mL via ORAL

## 2018-12-25 NOTE — Progress Notes (Addendum)
CT A/p consistent with SBO. Will make her NPO for tonight and have NGT placed to Low intermittent suction.. See how she does tomorrow. If able to have a bowel movement then adv diet. If not, will need Small bowel follow through procotol and Surgery consult.  Patient seen and case discussed again with her in her room. RN updated about the plan as well.   Rosanna Bickle

## 2018-12-25 NOTE — ED Provider Notes (Signed)
[facility] DEPT Provider Note   CSN: [id number] Arrival date & time: 12/25/18  0645    History   Chief Complaint Chief Complaint  Patient presents with  . Emesis    HPI Danielle Griffin is a 58 y.o. female with past medical history of hypertension, anemia, presenting to the emergency department with complaint of nausea and vomiting since Tuesday.  Patient states on Tuesday she ate [employer] and 2 hours later she had an episode of watery diarrhea.  Shortly following that she began having episodes of nonbloody nonbilious emesis.  She was evaluated at urgent care on 12/23/2018 and prescribed Bentyl and Zofran.  She states the Zofran initially work at urgent care, however when she returned home and tried to eat something the vomiting returned.  She states the Bentyl has been helping with her generalized intermittent abdominal cramping.  She states she no longer is having diarrhea.  She is having about 2-3 episodes of nonbloody bilious emesis per day and the Zofran is not working for her.  Denies vaginal bleeding or discharge, dysuria, fever, respiratory symptoms.  Past abdominal surgeries include laparoscopic vaginal hysterectomy.      The history is provided by the patient.    Past Medical History:  Diagnosis Date  . Abnormal heart rhythm   . Allergic rhinitis   . Anemia   . Cystitis   . Gout   . Hypertension   . Leukopenia   . Menorrhagia   . Migraine   . Numbness and tingling   . Obesity   . Vision abnormalities     Patient Active Problem List   Diagnosis Date Noted  . Abdominal pain 12/25/2018  . Essential hypertension 10/01/2013  . Allergic rhinitis, cause unspecified 10/01/2013    Past Surgical History:  Procedure Laterality Date  . CARPAL TUNNEL RELEASE    . ROBOTIC ASSISTED LAPAROSCOPIC VAGINAL HYSTERECTOMY WITH FIBROID REMOVAL    . TUBAL LIGATION       OB History   No obstetric history on file.      Home Medications     Prior to Admission medications   Medication Sig Start Date End Date Taking? Authorizing Provider  allopurinol (ZYLOPRIM) 300 MG tablet Take 300 mg by mouth daily.   Yes [provider]  cetirizine (ZYRTEC) 10 MG chewable tablet Chew 10 mg by mouth daily.   Yes [provider]  dicyclomine (BENTYL) 20 MG tablet Take 1 tablet (20 mg total) by mouth 2 (two) times daily. 12/23/18  Yes Yu, Amy V, PA-C  hydrochlorothiazide (HYDRODIURIL) 25 MG tablet Take 25 mg by mouth daily.   Yes [provider]  Multiple Vitamin (MULTIVITAMIN) tablet Take 1 tablet by mouth daily.   Yes [provider]  olmesartan (BENICAR) 40 MG tablet Take 40 mg by mouth daily.   Yes [provider]  ondansetron (ZOFRAN ODT) 4 MG disintegrating tablet Take 1 tablet (4 mg total) by mouth every 8 (eight) hours as needed for nausea or vomiting. 12/23/18  Yes Ok Edwards, PA-C    Family History Family History  Problem Relation Age of Onset  . Hypertension Mother   . Hyperlipidemia Mother   . Dementia Mother   . Esophageal cancer Father   . Hyperlipidemia Sister   . Hypertension Sister   . Hypertension Brother   . Hypertension Brother   . Colon cancer Maternal Aunt   . Heart attack Maternal Grandfather     Social History Social History  Tobacco Use  . Smoking status: Never Smoker  . Smokeless tobacco: Never Used  Substance Use Topics  . Alcohol use: Yes  . Drug use: No     Allergies   Norvasc [amlodipine besylate]; Penicillins; and Tylox [oxycodone-acetaminophen]   Review of Systems Review of Systems  Constitutional: Negative for fever.  Gastrointestinal: Positive for abdominal pain, nausea and vomiting. Negative for constipation and diarrhea.  Genitourinary: Negative for dysuria, frequency, vaginal bleeding and vaginal discharge.  All other systems reviewed and are negative.    Physical Exam Updated Vital Signs BP (!) 143/90 (BP Location: Right Arm)   Pulse 96    Temp 98.2 F (36.8 C) (Oral)   Resp 20   Ht 5\' 5"  (1.651 m)   Wt 108.9 kg   SpO2 98%   BMI 39.94 kg/m   Physical Exam Vitals signs and nursing note reviewed.  Constitutional:      General: She is not in acute distress.    Appearance: She is well-developed. She is not ill-appearing.  HENT:     Head: Normocephalic and atraumatic.     Mouth/Throat:     Mouth: Mucous membranes are moist.  Eyes:     Conjunctiva/sclera: Conjunctivae normal.  Cardiovascular:     Rate and Rhythm: Normal rate and regular rhythm.     Heart sounds: Normal heart sounds.  Pulmonary:     Effort: Pulmonary effort is normal. No respiratory distress.     Breath sounds: Normal breath sounds.  Abdominal:     General: Bowel sounds are normal.     Palpations: Abdomen is soft. There is no mass.     Tenderness: There is abdominal tenderness (Generalized). There is no guarding or rebound.  Skin:    General: Skin is warm.  Neurological:     Mental Status: She is alert.  Psychiatric:        Behavior: Behavior normal.      ED Treatments / Results  Labs (all labs ordered are listed, but only abnormal results are displayed) Labs Reviewed  COMPREHENSIVE METABOLIC PANEL - Abnormal; Notable for the following components:      Result Value   Glucose, Bld 146 (*)    BUN 37 (*)    Creatinine, Ser 2.40 (*)    Total Protein 8.2 (*)    GFR calc non Af Amer 21 (*)    GFR calc Af Amer 25 (*)    All other components within normal limits  CBC WITH DIFFERENTIAL/PLATELET - Abnormal; Notable for the following components:   HCT 46.5 (*)    All other components within normal limits  LIPASE, BLOOD  HIV ANTIBODY (ROUTINE TESTING W REFLEX)    EKG None  Radiology No results found.  Procedures Procedures (including critical care time)  Medications Ordered in ED Medications  sodium chloride 0.9 % bolus 1,000 mL (1,000 mLs Intravenous New Bag/Given 12/25/18 0854)    Followed by  0.9 %  sodium chloride infusion  (has no administration in time range)  dextrose 5 %-0.45 % sodium chloride infusion (has no administration in time range)  acetaminophen (TYLENOL) tablet 650 mg (has no administration in time range)    Or  acetaminophen (TYLENOL) suppository 650 mg (has no administration in time range)  traMADol (ULTRAM) tablet 50 mg (has no administration in time range)  senna-docusate (Senokot-S) tablet 1 tablet (has no administration in time range)  ondansetron (ZOFRAN) tablet 4 mg (has no administration in time range)    Or  ondansetron (ZOFRAN) injection  4 mg (has no administration in time range)  polyethylene glycol (MIRALAX / GLYCOLAX) packet 17 g (has no administration in time range)  hydrALAZINE (APRESOLINE) injection 10 mg (has no administration in time range)  sodium chloride 0.9 % bolus 1,000 mL (0 mLs Intravenous Stopped 12/25/18 0854)  promethazine (PHENERGAN) injection 25 mg (25 mg Intravenous Given 12/25/18 0802)  iohexol (OMNIPAQUE) 300 MG/ML solution 30 mL (30 mLs Oral Contrast Given 12/25/18 0931)     Initial Impression / Assessment and Plan / ED Course  I have reviewed the triage vital signs and the nursing notes.  Pertinent labs & imaging results that were available during my care of the patient were reviewed by me and considered in my medical decision making (see chart for details).  Clinical Course as of Dec 25 1002  Thu Dec 25, 2018  0847 Acute kidney injury. No known hx of renal dysfunction. Pt hypotensive in ED, and did not take her BP medications this morning. Pt discussed with Dr. Ashok Cordia, will admit for dehydration.  Creatinine(!): 2.40 [JR]  0848 Pt re-evaluated and reports improvement with nausea after phenergan. States she is sleepy from the phenergan. Agreeable to admission.   [JR]    Clinical Course User Index [JR] Burnette Sautter, [place] N, PA-C      Patient with past medical history of hypertension, presenting with 3 days of nausea and vomiting with intermittent generalized  abdominal cramping.  Symptoms initially began after eating a [employer] meal followed by an episode of diarrhea.  She was evaluated at urgent care 2 days ago and prescribed Zofran and Bentyl, however has not been unable to tolerate p.o. fluids or food since that time.  On exam today, abdomen without peritoneal signs, generalized tenderness, no guarding or rebound.  Vital signs are stable however pressures are low normal.  Afebrile.  Labs obtained, IV fluids and antiemetics administered.  Labs revealing acute kidney injury with creatinine 2.40, BUN 37.  No leukocytosis.  No previous labs to compare, however patient has no known history of renal dysfunction.  She did not treat her blood pressure this morning with her daily medications.  At this time, believe patient would benefit from admission for dehydration and IV hydration.  Dr. Reesa Chew with [facility] accepting admission.  Patient discussed with and evaluated by Dr. Ashok Cordia.  The patient appears reasonably stabilized for admission considering the current resources, flow, and capabilities available in the ED at this time, and I doubt any other [facility] requiring further screening and/or treatment in the ED prior to admission.   Final Clinical Impressions(s) / ED Diagnoses   Final diagnoses:  Nausea vomiting and diarrhea  Dehydration  Acute kidney injury [facility])    ED Discharge Orders    None       Eladio Dentremont, [place] N, PA-C 12/25/18 1004    Lajean Saver, MD 12/25/18 1424

## 2018-12-25 NOTE — H&P (Signed)
History and Physical    Danielle Griffin HTD:[id number] DOB: [birth year] DOA: 12/25/2018  PCP: Pa, [facility] Patient coming from: Home  Chief Complaint: Abdominal pain  HPI: Danielle Griffin is a 58 y.o. female with medical history significant of with past medical history of essential hypertension came to the hospital with complains of abdominal pain and unable to tolerate oral diet.  Patient states she had [employer] about 2 days ago and since then she has had episodes of nausea, vomiting with very poor oral intake.  She is also having nonspecific abdominal discomfort.  She went to urgent care and was given Zofran without resolution of her symptoms therefore came back to the ER with similar complaints.  Denies having similar symptoms in the past.  Denies any fevers, chills or any other respiratory symptoms.  In the ER patient was noted to be clinically dehydrated with creatinine of 2.4, unknown baseline.  Overall abdominal exam was benign.  She was admitted due to poor oral tolerance, IV fluids supportive care.   Review of Systems: As per HPI otherwise 10 point review of systems negative.  Review of Systems Otherwise negative except as per HPI, including: General: Denies fever, chills, night sweats or unintended weight loss. Resp: Denies cough, wheezing, shortness of breath. Cardiac: Denies chest pain, palpitations, orthopnea, paroxysmal nocturnal dyspnea. GI: Denies constipation GU: Denies dysuria, frequency, hesitancy or incontinence MS: Denies muscle aches, joint pain or swelling Neuro: Denies headache, neurologic deficits (focal weakness, numbness, tingling), abnormal gait Psych: Denies anxiety, depression, SI/HI/AVH Skin: Denies new rashes or lesions ID: Denies sick contacts, exotic exposures, travel  Past Medical History:  Diagnosis Date  . Abnormal heart rhythm   . Allergic rhinitis   . Anemia   . Cystitis   . Gout   . Hypertension   . Leukopenia   .  Menorrhagia   . Migraine   . Numbness and tingling   . Obesity   . Vision abnormalities     Past Surgical History:  Procedure Laterality Date  . CARPAL TUNNEL RELEASE    . ROBOTIC ASSISTED LAPAROSCOPIC VAGINAL HYSTERECTOMY WITH FIBROID REMOVAL    . TUBAL LIGATION      SOCIAL HISTORY:  reports that she has never smoked. She has never used smokeless tobacco. She reports current alcohol use. She reports that she does not use drugs.  Allergies  Allergen Reactions  . Norvasc [Amlodipine Besylate] Other (See Comments)    drowsiness  . Penicillins     Nausea Did it involve swelling of the face/tongue/throat, SOB, or low BP? no Did it involve sudden or severe rash/hives, skin peeling, or any reaction on the inside of your mouth or nose? no Did you need to seek medical attention at a hospital or doctor's office? no When did it last happen?2012 If all above answers are "NO", may proceed with cephalosporin use.   Roselee Culver [Oxycodone-Acetaminophen]     Migraines    FAMILY HISTORY: Family History  Problem Relation Age of Onset  . Hypertension Mother   . Hyperlipidemia Mother   . Dementia Mother   . Esophageal cancer Father   . Hyperlipidemia Sister   . Hypertension Sister   . Hypertension Brother   . Hypertension Brother   . Colon cancer Maternal Aunt   . Heart attack Maternal Grandfather      Prior to Admission medications   Medication Sig Start Date End Date Taking? Authorizing Provider  allopurinol (ZYLOPRIM) 300 MG tablet Take 300 mg by  mouth daily.   Yes [provider]  cetirizine (ZYRTEC) 10 MG chewable tablet Chew 10 mg by mouth daily.   Yes [provider]  dicyclomine (BENTYL) 20 MG tablet Take 1 tablet (20 mg total) by mouth 2 (two) times daily. 12/23/18  Yes Yu, Amy V, PA-C  hydrochlorothiazide (HYDRODIURIL) 25 MG tablet Take 25 mg by mouth daily.   Yes [provider]  Multiple Vitamin (MULTIVITAMIN) tablet Take 1 tablet by mouth  daily.   Yes [provider]  olmesartan (BENICAR) 40 MG tablet Take 40 mg by mouth daily.   Yes [provider]  ondansetron (ZOFRAN ODT) 4 MG disintegrating tablet Take 1 tablet (4 mg total) by mouth every 8 (eight) hours as needed for nausea or vomiting. 12/23/18  Yes Ok Edwards, PA-C    Physical Exam: Vitals:   12/25/18 0651 12/25/18 0851 12/25/18 1000  BP: 90/71 121/83 (!) 143/90  Pulse: 85 92 96  Resp: 18  20  Temp: 97.8 F (36.6 C)  98.2 F (36.8 C)  TempSrc: Oral  Oral  SpO2: 98% 98%   Weight: 108.9 kg    Height: 5\' 5"  (1.651 m)        Constitutional: NAD, calm, comfortable Eyes: PERRL, lids and conjunctivae normal ENMT: Mucous membranes are moist. Posterior pharynx clear of any exudate or lesions.Normal dentition.  Neck: normal, supple, no masses, no thyromegaly Respiratory: clear to auscultation bilaterally, no wheezing, no crackles. Normal respiratory effort. No accessory muscle use.  Cardiovascular: Regular rate and rhythm, no murmurs / rubs / gallops. No extremity edema. 2+ pedal pulses. No carotid bruits.  Abdomen: no tenderness, no masses palpated. No hepatosplenomegaly. Bowel sounds positive.  Musculoskeletal: no clubbing / cyanosis. No joint deformity upper and lower extremities. Good ROM, no contractures. Normal muscle tone.  Skin: no rashes, lesions, ulcers. No induration Neurologic: CN 2-12 grossly intact. Sensation intact, DTR normal. Strength 5/5 in all 4.  Psychiatric: Normal judgment and insight. Alert and oriented x 3. Normal mood.     Labs on Admission: I have personally reviewed following labs and imaging studies  CBC: Recent Labs  Lab 12/25/18 0751  WBC 5.6  NEUTROABS 3.8  HGB 14.6  HCT 46.5*  MCV 95.7  PLT 038   Basic Metabolic Panel: Recent Labs  Lab 12/25/18 0751  NA 139  K 3.6  CL 100  CO2 26  GLUCOSE 146*  BUN 37*  CREATININE 2.40*  CALCIUM 9.2   GFR: Estimated Creatinine Clearance: 31 mL/min (A) (by C-G  formula based on SCr of 2.4 mg/dL (H)). Liver Function Tests: Recent Labs  Lab 12/25/18 0751  AST 25  ALT 17  ALKPHOS 66  BILITOT 1.0  PROT 8.2*  ALBUMIN 4.6   Recent Labs  Lab 12/25/18 0751  LIPASE 26   No results for input(s): AMMONIA in the last 168 hours. Coagulation Profile: No results for input(s): INR, PROTIME in the last 168 hours. Cardiac Enzymes: No results for input(s): CKTOTAL, CKMB, CKMBINDEX, TROPONINI in the last 168 hours. BNP (last 3 results) No results for input(s): PROBNP in the last 8760 hours. HbA1C: No results for input(s): HGBA1C in the last 72 hours. CBG: No results for input(s): GLUCAP in the last 168 hours. Lipid Profile: No results for input(s): CHOL, HDL, LDLCALC, TRIG, CHOLHDL, LDLDIRECT in the last 72 hours. Thyroid Function Tests: No results for input(s): TSH, T4TOTAL, FREET4, T3FREE, THYROIDAB in the last 72 hours. Anemia Panel: No results for input(s): VITAMINB12, FOLATE, FERRITIN, TIBC,  IRON, RETICCTPCT in the last 72 hours. Urine analysis: No results found for: COLORURINE, APPEARANCEUR, LABSPEC, PHURINE, GLUCOSEU, HGBUR, BILIRUBINUR, KETONESUR, PROTEINUR, UROBILINOGEN, NITRITE, LEUKOCYTESUR Sepsis Labs: !!!!!!!!!!!!!!!!!!!!!!!!!!!!!!!!!!!!!!!!!!!! @LABRCNTIP (procalcitonin:4,lacticidven:4) )No results found for this or any previous visit (from the past 240 hour(s)).   Radiological Exams on Admission: No results found.   All images have been reviewed by me personally.    Assessment/Plan Principal Problem:   Abdominal pain Active Problems:   Essential hypertension   Hypokalemia    Nonspecific abdominal discomfort with nausea and vomiting, likely gastroenteritis Moderate dehydration -Admit the patient for observation.  Will check CT abdomen pelvis with p.o. contrast only to rule out any acute pathology -Advance diet as tolerated.  IV fluids, monitor urine output -Antiemetics PRN. -Supportive care.  Acute kidney injury  -Unknown baseline.  Creatinine currently is 2.4, suspect prerenal.  Getting IV fluids.  Monitor urine output.  Hypokalemia -Repletion ordered  Essential hypertension -Home medications on hold currently.  Patient is getting hydration.  ARB on hold.  IV hydralazine PRN  DVT prophylaxis: SCDs Code Status: Full code Family Communication: None at bedside Disposition Plan: To be determined Consults called: None Admission status: Observation admission to MedSurg   Time Spent: 65 minutes.  >50% of the time was devoted to discussing the patients care, assessment, plan and disposition with other care givers along with counseling the patient about the risks and benefits of treatment.    Lashun Ramseyer Arsenio Loader MD [facility]  If 7PM-7AM, please contact night-coverage [email or web address]  12/25/2018, 12:48 PM

## 2018-12-25 NOTE — ED Triage Notes (Signed)
Pt was seen Tuesday at Urgent Care and received zofran and bentyl for vomiting, she states that she's still unable to keep anything down and can't take the meds

## 2018-12-25 NOTE — Plan of Care (Signed)
Patient is progressing n/v has slowed down.

## 2018-12-26 ENCOUNTER — Observation Stay (HOSPITAL_COMMUNITY): Payer: BLUE CROSS/BLUE SHIELD

## 2018-12-26 ENCOUNTER — Inpatient Hospital Stay (HOSPITAL_COMMUNITY): Payer: BLUE CROSS/BLUE SHIELD

## 2018-12-26 DIAGNOSIS — R1010 Upper abdominal pain, unspecified: Secondary | ICD-10-CM | POA: Diagnosis not present

## 2018-12-26 DIAGNOSIS — K5651 Intestinal adhesions [bands], with partial obstruction: Secondary | ICD-10-CM | POA: Diagnosis not present

## 2018-12-26 DIAGNOSIS — B9620 Unspecified Escherichia coli [E. coli] as the cause of diseases classified elsewhere: Secondary | ICD-10-CM | POA: Diagnosis present

## 2018-12-26 DIAGNOSIS — R17 Unspecified jaundice: Secondary | ICD-10-CM | POA: Diagnosis present

## 2018-12-26 DIAGNOSIS — R945 Abnormal results of liver function studies: Secondary | ICD-10-CM | POA: Diagnosis not present

## 2018-12-26 DIAGNOSIS — N179 Acute kidney failure, unspecified: Secondary | ICD-10-CM | POA: Diagnosis not present

## 2018-12-26 DIAGNOSIS — E860 Dehydration: Secondary | ICD-10-CM | POA: Diagnosis present

## 2018-12-26 DIAGNOSIS — D649 Anemia, unspecified: Secondary | ICD-10-CM | POA: Diagnosis present

## 2018-12-26 DIAGNOSIS — R140 Abdominal distension (gaseous): Secondary | ICD-10-CM | POA: Diagnosis not present

## 2018-12-26 DIAGNOSIS — K567 Ileus, unspecified: Secondary | ICD-10-CM | POA: Diagnosis present

## 2018-12-26 DIAGNOSIS — M109 Gout, unspecified: Secondary | ICD-10-CM | POA: Diagnosis present

## 2018-12-26 DIAGNOSIS — R1084 Generalized abdominal pain: Secondary | ICD-10-CM | POA: Diagnosis not present

## 2018-12-26 DIAGNOSIS — K565 Intestinal adhesions [bands], unspecified as to partial versus complete obstruction: Secondary | ICD-10-CM | POA: Diagnosis present

## 2018-12-26 DIAGNOSIS — K219 Gastro-esophageal reflux disease without esophagitis: Secondary | ICD-10-CM | POA: Diagnosis present

## 2018-12-26 DIAGNOSIS — Z800 Family history of malignant neoplasm of digestive organs: Secondary | ICD-10-CM | POA: Diagnosis not present

## 2018-12-26 DIAGNOSIS — E876 Hypokalemia: Secondary | ICD-10-CM | POA: Diagnosis not present

## 2018-12-26 DIAGNOSIS — K56609 Unspecified intestinal obstruction, unspecified as to partial versus complete obstruction: Secondary | ICD-10-CM | POA: Diagnosis not present

## 2018-12-26 DIAGNOSIS — Z1612 Extended spectrum beta lactamase (ESBL) resistance: Secondary | ICD-10-CM | POA: Diagnosis present

## 2018-12-26 DIAGNOSIS — D259 Leiomyoma of uterus, unspecified: Secondary | ICD-10-CM | POA: Diagnosis present

## 2018-12-26 DIAGNOSIS — R739 Hyperglycemia, unspecified: Secondary | ICD-10-CM | POA: Diagnosis not present

## 2018-12-26 DIAGNOSIS — R112 Nausea with vomiting, unspecified: Secondary | ICD-10-CM | POA: Diagnosis not present

## 2018-12-26 DIAGNOSIS — K5669 Other partial intestinal obstruction: Secondary | ICD-10-CM | POA: Diagnosis not present

## 2018-12-26 DIAGNOSIS — I10 Essential (primary) hypertension: Secondary | ICD-10-CM | POA: Diagnosis not present

## 2018-12-26 DIAGNOSIS — Z8349 Family history of other endocrine, nutritional and metabolic diseases: Secondary | ICD-10-CM | POA: Diagnosis not present

## 2018-12-26 DIAGNOSIS — Z8249 Family history of ischemic heart disease and other diseases of the circulatory system: Secondary | ICD-10-CM | POA: Diagnosis not present

## 2018-12-26 DIAGNOSIS — N390 Urinary tract infection, site not specified: Secondary | ICD-10-CM | POA: Diagnosis present

## 2018-12-26 DIAGNOSIS — Z6836 Body mass index (BMI) 36.0-36.9, adult: Secondary | ICD-10-CM | POA: Diagnosis not present

## 2018-12-26 LAB — CBC
HCT: 43.7 % (ref 36.0–46.0)
Hemoglobin: 13.9 g/dL (ref 12.0–15.0)
MCH: 31 pg (ref 26.0–34.0)
MCHC: 31.8 g/dL (ref 30.0–36.0)
MCV: 97.3 fL (ref 80.0–100.0)
Platelets: 230 10*3/uL (ref 150–400)
RBC: 4.49 MIL/uL (ref 3.87–5.11)
RDW: 13.9 % (ref 11.5–15.5)
WBC: 4.8 10*3/uL (ref 4.0–10.5)
nRBC: 0 % (ref 0.0–0.2)

## 2018-12-26 LAB — COMPREHENSIVE METABOLIC PANEL
ALT: 15 U/L (ref 0–44)
AST: 22 U/L (ref 15–41)
Albumin: 3.8 g/dL (ref 3.5–5.0)
Alkaline Phosphatase: 59 U/L (ref 38–126)
Anion gap: 12 (ref 5–15)
BUN: 31 mg/dL — ABNORMAL HIGH (ref 6–20)
CO2: 25 mmol/L (ref 22–32)
Calcium: 8.6 mg/dL — ABNORMAL LOW (ref 8.9–10.3)
Chloride: 103 mmol/L (ref 98–111)
Creatinine, Ser: 1.4 mg/dL — ABNORMAL HIGH (ref 0.44–1.00)
GFR calc Af Amer: 48 mL/min — ABNORMAL LOW (ref >60)
GFR calc non Af Amer: 41 mL/min — ABNORMAL LOW (ref >60)
Glucose, Bld: 138 mg/dL — ABNORMAL HIGH (ref 70–99)
Potassium: 3.5 mmol/L (ref 3.5–5.1)
Sodium: 140 mmol/L (ref 135–145)
Total Bilirubin: 1.1 mg/dL (ref 0.3–1.2)
Total Protein: 6.8 g/dL (ref 6.5–8.1)

## 2018-12-26 LAB — HIV ANTIBODY (ROUTINE TESTING W REFLEX): HIV Screen 4th Generation wRfx: NONREACTIVE

## 2018-12-26 LAB — GLUCOSE, CAPILLARY: Glucose-Capillary: 129 mg/dL — ABNORMAL HIGH (ref 70–99)

## 2018-12-26 MED ORDER — ONDANSETRON HCL 4 MG/2ML IJ SOLN
4.0000 mg | Freq: Four times a day (QID) | INTRAMUSCULAR | Status: DC | PRN
  Administered 2018-12-29: 4 mg via INTRAVENOUS
  Filled 2018-12-26: qty 2

## 2018-12-26 MED ORDER — DIATRIZOATE MEGLUMINE & SODIUM 66-10 % PO SOLN
90.0000 mL | Freq: Once | ORAL | Status: AC
  Administered 2018-12-26: 90 mL via NASOGASTRIC
  Filled 2018-12-26: qty 90

## 2018-12-26 MED ORDER — MAGIC MOUTHWASH
15.0000 mL | Freq: Four times a day (QID) | ORAL | Status: DC | PRN
  Filled 2018-12-26: qty 15

## 2018-12-26 MED ORDER — LIP MEDEX EX OINT
1.0000 "application " | TOPICAL_OINTMENT | Freq: Two times a day (BID) | CUTANEOUS | Status: DC
  Administered 2018-12-26 – 2019-01-06 (×19): 1 via TOPICAL

## 2018-12-26 MED ORDER — PROCHLORPERAZINE EDISYLATE 10 MG/2ML IJ SOLN: 5.0000 mg | INTRAMUSCULAR | Status: DC | PRN

## 2018-12-26 MED ORDER — ALUM & MAG HYDROXIDE-SIMETH 200-200-20 MG/5ML PO SUSP: 30.0000 mL | Freq: Four times a day (QID) | ORAL | Status: DC | PRN

## 2018-12-26 MED ORDER — BISACODYL 10 MG RE SUPP
10.0000 mg | Freq: Two times a day (BID) | RECTAL | Status: DC | PRN
  Administered 2018-12-27: 17:00:00 10 mg via RECTAL
  Filled 2018-12-26: qty 1

## 2018-12-26 MED ORDER — METHOCARBAMOL 1000 MG/10ML IJ SOLN
1000.0000 mg | Freq: Four times a day (QID) | INTRAVENOUS | Status: DC | PRN
  Administered 2018-12-27 – 2018-12-28 (×2): 1000 mg via INTRAVENOUS
  Filled 2018-12-26 (×3): qty 10

## 2018-12-26 MED ORDER — METOPROLOL TARTRATE 5 MG/5ML IV SOLN
5.0000 mg | Freq: Four times a day (QID) | INTRAVENOUS | Status: DC | PRN
  Administered 2018-12-28: 14:00:00 5 mg via INTRAVENOUS
  Filled 2018-12-26 (×2): qty 5

## 2018-12-26 MED ORDER — LACTATED RINGERS IV BOLUS: 1000.0000 mL | Freq: Three times a day (TID) | INTRAVENOUS | Status: DC | PRN

## 2018-12-26 MED ORDER — DIPHENHYDRAMINE HCL 50 MG/ML IJ SOLN
12.5000 mg | Freq: Four times a day (QID) | INTRAMUSCULAR | Status: DC | PRN
  Administered 2018-12-28 – 2019-01-05 (×2): 25 mg via INTRAVENOUS
  Filled 2018-12-26 (×2): qty 1

## 2018-12-26 MED ORDER — SODIUM CHLORIDE 0.9 % IV SOLN
8.0000 mg | Freq: Four times a day (QID) | INTRAVENOUS | Status: DC | PRN
  Filled 2018-12-26: qty 4

## 2018-12-26 NOTE — Plan of Care (Signed)

## 2018-12-26 NOTE — Progress Notes (Signed)
Another attempt made to reach IR; this time spoke with Ronalee Belts and let him know when Gastrografin was given. Donne Hazel, RN

## 2018-12-26 NOTE — Progress Notes (Signed)
Another call attempted to radiology; no answer. Donne Hazel, RN

## 2018-12-26 NOTE — Consult Note (Signed)
[facility] Surgery Consult Note  Danielle Griffin October 04, 1960  [id number].    Requesting MD: Dr. Cordelia Poche Chief Complaint:   abdominal pain nausea vomiting diarrhea Reason for Consult: Possible small bowel obstruction  HPI:  Is a 58 year old female who presented to urgent care 12/23/2018 with diarrhea on Monday, 12/22/2018.  Diarrhea stopped but then she developed abdominal painacross the bottom of her abdomen with nausea and multiple episodes of emesis.  Diarrhea stopped after Monday. She was treated was Zofran and Bentyl and discharge.  She presented again to the emergency department at [facility] on 12/25/2018, with ongoing symptoms.  Symptoms started after eating [employer].  Work-up in the ED at [facility], she was afebrile blood pressure was slightly elevated but stable.  CMP showed a glucose of 146, creatinine of 2.40, total protein was 8.2 otherwise normal.  WBC 5.6, hemoglobin 14.6, hematocrit 46.5, platelets 278,000.  HIV screen was negative. CT scan of the abdomen/pelvis without contrast: Showed the stomach was within normal limits, and the appendix appeared normal there were numerous loops of distended small bowel in the mid abdomen largest loops measuring 4.0 cm.  The distal small bowel is decompressed, transition point difficult to identify.  Suspected in the area of the left adnexa.  She has uterine fibroids.  Small volume of perihepatic ascites.  Small right pleural effusion.  She was seen by Medicine, she was admitted for dehydration with an elevated creatinine, nonspecific abdominal discomfort nausea and vomiting likely gastroenteritis.  NG was placed, she was admitted placed on IV hydration, GI decompression, and bowel rest.  She drained 1500 out of her NG yesterday, no stools recorded.  She remains afebrile blood pressure is slightly improved.,  Vital signs are stable.  Labs shows her creatinine is improving with hydration creatinine is down to 1.40.  CBC  remains normal.  WBC is 4.8.  Repeat film this AM shows an NG tube in the tip of the stomach.  Mildly dilated loops of small bowel in the central and right abdomen are unchanged no acute osseous abnormalities.  She put 1500 out through her NG yesterday.  She has had no further diarrhea since Monday 11/2718.  Nausea and vomiting has resolved with NG placement.  She has had a couple swipes stool through her rectum while voiding, once yesterday and once today.  We are ask to see.  Prior surgeries includes a tubal ligation, and robotic assisted laparoscopic vaginal hysterectomy with fibroid removal.  ROS: Review of Systems  Constitutional: Negative.   HENT: Negative.   Eyes: Negative.   Respiratory: Negative.   Cardiovascular: Negative.   Gastrointestinal: Positive for abdominal pain, diarrhea, nausea and vomiting. Negative for blood in stool, constipation, heartburn and melena.  Genitourinary: Negative.   Musculoskeletal: Negative.   Skin: Negative.   Neurological: Negative.   Endo/Heme/Allergies: Negative.   Psychiatric/Behavioral: Negative.     Family History  Problem Relation Age of Onset  . Hypertension Mother   . Hyperlipidemia Mother   . Dementia Mother   . Esophageal cancer Father   . Hyperlipidemia Sister   . Hypertension Sister   . Hypertension Brother   . Hypertension Brother   . Colon cancer Maternal Aunt   . Heart attack Maternal Grandfather     Past Medical History:  Diagnosis Date  . Abnormal heart rhythm   . Allergic rhinitis   . Anemia   . Cystitis   . Gout   . Hypertension   . Leukopenia   .  Menorrhagia   . Migraine   . Numbness and tingling   . Obesity   . Vision abnormalities     Past Surgical History:  Procedure Laterality Date  . CARPAL TUNNEL RELEASE    .  Symptomatic uterine myoma Myomectomy will by laparotomy, Dr. Princess Bruins.  09/26/2005  patient notes that she does not think her uterus was removed that she just had fibroids removed.    . TUBAL LIGATION      Social History:  reports that she has never smoked. She has never used smokeless tobacco. She reports current alcohol use. She reports that she does not use drugs.  Allergies:  Allergies  Allergen Reactions  . Norvasc [Amlodipine Besylate] Other (See Comments)    drowsiness  . Penicillins     Nausea Did it involve swelling of the face/tongue/throat, SOB, or low BP? no Did it involve sudden or severe rash/hives, skin peeling, or any reaction on the inside of your mouth or nose? no Did you need to seek medical attention at a hospital or doctor's office? no When did it last happen?2012 If all above answers are "NO", may proceed with cephalosporin use.   Roselee Culver [Oxycodone-Acetaminophen]     Migraines    Medications Prior to Admission  Medication Sig Dispense Refill  . allopurinol (ZYLOPRIM) 300 MG tablet Take 300 mg by mouth daily.    . cetirizine (ZYRTEC) 10 MG chewable tablet Chew 10 mg by mouth daily.    Marland Kitchen dicyclomine (BENTYL) 20 MG tablet Take 1 tablet (20 mg total) by mouth 2 (two) times daily. 20 tablet 0  . hydrochlorothiazide (HYDRODIURIL) 25 MG tablet Take 25 mg by mouth daily.    . Multiple Vitamin (MULTIVITAMIN) tablet Take 1 tablet by mouth daily.    Marland Kitchen olmesartan (BENICAR) 40 MG tablet Take 40 mg by mouth daily.    . ondansetron (ZOFRAN ODT) 4 MG disintegrating tablet Take 1 tablet (4 mg total) by mouth every 8 (eight) hours as needed for nausea or vomiting. 20 tablet 0    Blood pressure 122/87, pulse (!) 107, temperature 97.9 F (36.6 C), temperature source Oral, resp. rate 18, height 5\' 5"  (1.651 m), weight 99.8 kg, SpO2 91 %. Physical Exam: Physical Exam Constitutional:      General: She is not in acute distress.    Appearance: Normal appearance. She is normal weight. She is not ill-appearing, toxic-appearing or diaphoretic.  HENT:     Head: Normocephalic and atraumatic.     Nose:     Comments: NG in place    Mouth/Throat:      Mouth: Mucous membranes are moist.     Pharynx: Oropharynx is clear. No oropharyngeal exudate.  Eyes:     General: No scleral icterus.       Right eye: No discharge.        Left eye: No discharge.     Comments: Pupils are equal  Neck:     Musculoskeletal: Normal range of motion and neck supple. No neck rigidity or muscular tenderness.     Vascular: No carotid bruit.  Cardiovascular:     Rate and Rhythm: Normal rate and regular rhythm.     Pulses: Normal pulses.     Heart sounds: Normal heart sounds. No murmur.     Comments: She is slightly tachycardic. Pulmonary:     Effort: Pulmonary effort is normal.     Breath sounds: Normal breath sounds. No stridor. No wheezing, rhonchi or rales.  Chest:  Chest wall: No tenderness.  Abdominal:     General: There is distension (She is up in the chair as I cannot really tell if she is distended or not.  She does not feel overly distended or look overly distended on chair exam.).     Palpations: Abdomen is soft. There is no mass.     Tenderness: There is no abdominal tenderness. There is no right CVA tenderness, left CVA tenderness, guarding or rebound.     Hernia: No hernia is present.  Musculoskeletal:        General: No swelling or tenderness.  Lymphadenopathy:     Cervical: No cervical adenopathy.  Skin:    General: Skin is warm and dry.  Neurological:     General: No focal deficit present.     Mental Status: She is alert and oriented to person, place, and time. Mental status is at baseline.     Cranial Nerves: No cranial nerve deficit.  Psychiatric:        Mood and Affect: Mood normal.        Behavior: Behavior normal.        Thought Content: Thought content normal.        Judgment: Judgment normal.     Results for orders placed or performed during the hospital encounter of 12/25/18 (from the past 48 hour(s))  Comprehensive metabolic panel     Status: Abnormal   Collection Time: 12/25/18  7:51 AM  Result Value Ref Range    Sodium 139 135 - 145 mmol/L   Potassium 3.6 3.5 - 5.1 mmol/L   Chloride 100 98 - 111 mmol/L   CO2 26 22 - 32 mmol/L   Glucose, Bld 146 (H) 70 - 99 mg/dL   BUN 37 (H) 6 - 20 mg/dL   Creatinine, Ser 2.40 (H) 0.44 - 1.00 mg/dL   Calcium 9.2 8.9 - 10.3 mg/dL   Total Protein 8.2 (H) 6.5 - 8.1 g/dL   Albumin 4.6 3.5 - 5.0 g/dL   AST 25 15 - 41 U/L   ALT 17 0 - 44 U/L   Alkaline Phosphatase 66 38 - 126 U/L   Total Bilirubin 1.0 0.3 - 1.2 mg/dL   GFR calc non Af Amer 21 (L) >60 mL/min   GFR calc Af Amer 25 (L) >60 mL/min   Anion gap 13 5 - 15    Comment: Performed at [facility], [place] [address], [place], [place] [address]  CBC with Differential     Status: Abnormal   Collection Time: 12/25/18  7:51 AM  Result Value Ref Range   WBC 5.6 4.0 - 10.5 K/uL   RBC 4.86 3.87 - 5.11 MIL/uL   Hemoglobin 14.6 12.0 - 15.0 g/dL   HCT 46.5 (H) 36.0 - 46.0 %   MCV 95.7 80.0 - 100.0 fL   MCH 30.0 26.0 - 34.0 pg   MCHC 31.4 30.0 - 36.0 g/dL   RDW 13.8 11.5 - 15.5 %   Platelets 278 150 - 400 K/uL   nRBC 0.0 0.0 - 0.2 %   Neutrophils Relative % 69 %   Neutro Abs 3.8 1.7 - 7.7 K/uL   Lymphocytes Relative 19 %   Lymphs Abs 1.0 0.7 - 4.0 K/uL   Monocytes Relative 11 %   Monocytes Absolute 0.6 0.1 - 1.0 K/uL   Eosinophils Relative 0 %   Eosinophils Absolute 0.0 0.0 - 0.5 K/uL   Basophils Relative 0 %   Basophils Absolute 0.0 0.0 -  0.1 K/uL   Immature Granulocytes 1 %   Abs Immature Granulocytes 0.06 0.00 - 0.07 K/uL    Comment: Performed at [facility], [place] [address], [place], [place] [address]  Lipase, blood     Status: None   Collection Time: 12/25/18  7:51 AM  Result Value Ref Range   Lipase 26 11 - 51 U/L    Comment: Performed at [facility], [place] [address], [place], [place] [address]  HIV antibody (Routine Testing)     Status: None   Collection Time: 12/25/18 11:23 AM  Result Value Ref Range   HIV Screen 4th Generation wRfx Non  Reactive Non Reactive    Comment: (NOTE) Performed At: [facility] [place], [place] [id number] Rush Farmer MD RC:[id number]   Comprehensive metabolic panel     Status: Abnormal   Collection Time: 12/26/18  3:34 AM  Result Value Ref Range   Sodium 140 135 - 145 mmol/L   Potassium 3.5 3.5 - 5.1 mmol/L   Chloride 103 98 - 111 mmol/L   CO2 25 22 - 32 mmol/L   Glucose, Bld 138 (H) 70 - 99 mg/dL   BUN 31 (H) 6 - 20 mg/dL   Creatinine, Ser 1.40 (H) 0.44 - 1.00 mg/dL   Calcium 8.6 (L) 8.9 - 10.3 mg/dL   Total Protein 6.8 6.5 - 8.1 g/dL   Albumin 3.8 3.5 - 5.0 g/dL   AST 22 15 - 41 U/L   ALT 15 0 - 44 U/L   Alkaline Phosphatase 59 38 - 126 U/L   Total Bilirubin 1.1 0.3 - 1.2 mg/dL   GFR calc non Af Amer 41 (L) >60 mL/min   GFR calc Af Amer 48 (L) >60 mL/min   Anion gap 12 5 - 15    Comment: Performed at [facility], [place] [address], [place], [place] [address]  CBC     Status: None   Collection Time: 12/26/18  3:34 AM  Result Value Ref Range   WBC 4.8 4.0 - 10.5 K/uL   RBC 4.49 3.87 - 5.11 MIL/uL   Hemoglobin 13.9 12.0 - 15.0 g/dL   HCT 43.7 36.0 - 46.0 %   MCV 97.3 80.0 - 100.0 fL   MCH 31.0 26.0 - 34.0 pg   MCHC 31.8 30.0 - 36.0 g/dL   RDW 13.9 11.5 - 15.5 %   Platelets 230 150 - 400 K/uL   nRBC 0.0 0.0 - 0.2 %    Comment: Performed at [facility], [place] [address], [place], [place] [address]  Glucose, capillary     Status: Abnormal   Collection Time: 12/26/18  7:46 AM  Result Value Ref Range   Glucose-Capillary 129 (H) 70 - 99 mg/dL   Ct Abdomen Pelvis Wo Contrast  Result Date: 12/25/2018 CLINICAL DATA:  Abdominal pain, gastroenteritis or colitis suspected EXAM: CT ABDOMEN AND PELVIS WITHOUT CONTRAST TECHNIQUE: Multidetector CT imaging of the abdomen and pelvis was performed following the standard protocol without IV contrast. Oral enteric contrast was administered. COMPARISON:  None. FINDINGS: Lower chest: Small  right pleural effusion with extensive bibasilar scarring and/or atelectasis. Hepatobiliary: No focal liver abnormality is seen. No gallstones, gallbladder wall thickening, or biliary dilatation. Pancreas: Unremarkable. No pancreatic ductal dilatation or surrounding inflammatory changes. Spleen: Normal in size without focal abnormality. Adrenals/Urinary Tract: Adrenal glands are unremarkable. Kidneys are normal, without renal calculi, focal lesion, or hydronephrosis. Bladder is unremarkable. Stomach/Bowel: Stomach is within normal  limits. Appendix appears normal. There are numerous loops of distended small bowel in the mid abdomen, largest loops measuring up to 4.0 cm. The distal small bowel is decompressed. Transition point is difficult to identify although suspect it is in the vicinity of the left adnexa (series 2, image 72, series 4, image 45). Vascular/Lymphatic: No significant vascular findings are present. No enlarged abdominal or pelvic lymph nodes. Reproductive: Uterine fibroids. Other: No abdominal wall hernia or abnormality. Small volume perihepatic ascites. Musculoskeletal: No acute or significant osseous findings. Bilateral pars defects of L5. IMPRESSION: 1. There are numerous loops of distended small bowel in the mid abdomen, largest loops measuring up to 4.0 cm. The distal small bowel is decompressed. Findings are consistent with small bowel obstruction. Transition point is difficult to identify although suspect it is in the vicinity of the left adnexa (series 2, image 72, series 4, image 45). 2.  Small volume perihepatic ascites, of uncertain etiology. 3.  Small right pleural effusion. 4.  Fibroids. Electronically Signed   By: Eddie Candle M.D.   On: 12/25/2018 14:44   Dg Abd 1 View  Result Date: 12/25/2018 CLINICAL DATA:  NG tube placement EXAM: ABDOMEN - 1 VIEW COMPARISON:  None. FINDINGS: NG tube tip and side port project over the stomach. Dilated small bowel in the right lower quadrant. Lung  bases are clear. IMPRESSION: NG-tube tip and side port project over the stomach. Electronically Signed   By: Ulyses Jarred M.D.   On: 12/25/2018 19:40   Dg Abd Portable 1v  Result Date: 12/26/2018 CLINICAL DATA:  Small bowel obstruction. EXAM: PORTABLE ABDOMEN - 1 VIEW COMPARISON:  Abdominal x-ray from yesterday. FINDINGS: NG tube tip in the stomach. Mildly dilated loops of small bowel in the central and right abdomen are unchanged. No acute osseous abnormality. IMPRESSION: 1. Unchanged small bowel obstruction. Electronically Signed   By: Titus Dubin M.D.   On: 12/26/2018 10:27   . sodium chloride    . lactated ringers    . methocarbamol (ROBAXIN) IV    . ondansetron (ZOFRAN) IV       Assessment/Plan Acute kidney disease secondary to dehydration Hypertension Gout  Abdominal pain, diarrhea, nausea and vomiting Gastroenteritis VS small bowel obstruction Hx tubal ligation/Myomectomy will by laparotomy,  FEN: IV fluids/n.p.o. ID: None DVT: SCDs/he can have anticoagulation prophylaxis from our standpoint Follow-up: To be determined  Plan: I think is most likely she had some form of enteritis.  Small bowel still remains dilated.  Will order the small bowel protocol, will continue NG decompression, ice chips and sips for now.  We will see again in the a.m. after the small bowel protocol.  Earnstine Regal [facility] Surgery 12/26/2018, 12:20 PM Pager: [phone] Consults: [phone]

## 2018-12-26 NOTE — Progress Notes (Signed)
PROGRESS NOTE    Danielle Griffin  ATF:[id number] DOB: 08-02-1961 DOA: 12/25/2018 PCP: [facility]   Brief Narrative: Danielle Griffin is a 58 y.o. female with medical history significant of with past medical history of essential hypertension. She presented secondary to abdominal pain and found to have an SBO. NG tube placed.   Assessment & Plan:   Principal Problem:   Abdominal pain Active Problems:   Essential hypertension   Hypokalemia   AKI (acute kidney injury) ([place])   Small bowel obstruction Seen on CT. Possible transition point in area of left adnexa. NG tube placed with 1.5 L out since admission (likely including oral contrast from CT). She has put out 600 mL of dark liquid since shift change this morning. Overall, she is feeling better and eager to eat -Continue NG tube -Abdominal x-ray -General surgery consultation secondary to amount of NG output -NPO; IV fluids  Acute kidney injury Unknown baseline. Creatinine of 2.4 on admission. Given IV fluids with improvement of creatinine to 1.4 today. -IV fluids  Essential hypertension On hydrochlorothiazide and olmesartan as an outpatient. Held secondary to AKI and dehydration -Continue Hydralazine prn   DVT prophylaxis: SCDs Code Status:   Code Status: Full Code Family Communication: None Disposition Plan: Discharge pending improvement of SBO and general surgery recommendations   Consultants:   General surgery  Procedures:   NG tube (4/30>>  Antimicrobials:  None    Subjective: Feels better today. No abdominal pain. Passing gas. No bowel movement.  Objective: Vitals:   12/25/18 1324 12/25/18 2107 12/26/18 0500 12/26/18 0702  BP: (!) 118/99 (!) 136/98  122/87  Pulse: 91 (!) 106  (!) 107  Resp: 16 18  18   Temp: 98.6 F (37 C) 98.3 F (36.8 C)  97.9 F (36.6 C)  TempSrc: Oral Oral  Oral  SpO2:  94%  91%  Weight:   99.8 kg   Height:        Intake/Output Summary (Last 24  hours) at 12/26/2018 0930 Last data filed at 12/26/2018 0600 Gross per 24 hour  Intake 1860.83 ml  Output 1800 ml  Net 60.83 ml   Filed Weights   12/25/18 0651 12/26/18 0500  Weight: 108.9 kg 99.8 kg    Examination:  General exam: Appears calm and comfortable Respiratory system: Clear to auscultation. Respiratory effort normal. Cardiovascular system: S1 & S2 heard, RRR. No murmurs, rubs, gallops or clicks. Gastrointestinal system: Abdomen is distended, soft and nontender. Very minimal/infrequent bowel sounds Central nervous system: Alert and oriented. No focal neurological deficits. Extremities: No edema. No calf tenderness Skin: No cyanosis. No rashes Psychiatry: Judgement and insight appear normal. Mood & affect appropriate.     Data Reviewed: I have personally reviewed following labs and imaging studies  CBC: Recent Labs  Lab 12/25/18 0751 12/26/18 0334  WBC 5.6 4.8  NEUTROABS 3.8  --   HGB 14.6 13.9  HCT 46.5* 43.7  MCV 95.7 97.3  PLT 278 270   Basic Metabolic Panel: Recent Labs  Lab 12/25/18 0751 12/26/18 0334  NA 139 140  K 3.6 3.5  CL 100 103  CO2 26 25  GLUCOSE 146* 138*  BUN 37* 31*  CREATININE 2.40* 1.40*  CALCIUM 9.2 8.6*   GFR: Estimated Creatinine Clearance: 50.6 mL/min (A) (by C-G formula based on SCr of 1.4 mg/dL (H)). Liver Function Tests: Recent Labs  Lab 12/25/18 0751 12/26/18 0334  AST 25 22  ALT 17 15  ALKPHOS 66 59  BILITOT 1.0 1.1  PROT 8.2* 6.8  ALBUMIN 4.6 3.8   Recent Labs  Lab 12/25/18 0751  LIPASE 26   No results for input(s): AMMONIA in the last 168 hours. Coagulation Profile: No results for input(s): INR, PROTIME in the last 168 hours. Cardiac Enzymes: No results for input(s): CKTOTAL, CKMB, CKMBINDEX, TROPONINI in the last 168 hours. BNP (last 3 results) No results for input(s): PROBNP in the last 8760 hours. HbA1C: No results for input(s): HGBA1C in the last 72 hours. CBG: Recent Labs  Lab 12/26/18 0746    GLUCAP 129*   Lipid Profile: No results for input(s): CHOL, HDL, LDLCALC, TRIG, CHOLHDL, LDLDIRECT in the last 72 hours. Thyroid Function Tests: No results for input(s): TSH, T4TOTAL, FREET4, T3FREE, THYROIDAB in the last 72 hours. Anemia Panel: No results for input(s): VITAMINB12, FOLATE, FERRITIN, TIBC, IRON, RETICCTPCT in the last 72 hours. Sepsis Labs: No results for input(s): PROCALCITON, LATICACIDVEN in the last 168 hours.  No results found for this or any previous visit (from the past 240 hour(s)).       Radiology Studies: Ct Abdomen Pelvis Wo Contrast  Result Date: 12/25/2018 CLINICAL DATA:  Abdominal pain, gastroenteritis or colitis suspected EXAM: CT ABDOMEN AND PELVIS WITHOUT CONTRAST TECHNIQUE: Multidetector CT imaging of the abdomen and pelvis was performed following the standard protocol without IV contrast. Oral enteric contrast was administered. COMPARISON:  None. FINDINGS: Lower chest: Small right pleural effusion with extensive bibasilar scarring and/or atelectasis. Hepatobiliary: No focal liver abnormality is seen. No gallstones, gallbladder wall thickening, or biliary dilatation. Pancreas: Unremarkable. No pancreatic ductal dilatation or surrounding inflammatory changes. Spleen: Normal in size without focal abnormality. Adrenals/Urinary Tract: Adrenal glands are unremarkable. Kidneys are normal, without renal calculi, focal lesion, or hydronephrosis. Bladder is unremarkable. Stomach/Bowel: Stomach is within normal limits. Appendix appears normal. There are numerous loops of distended small bowel in the mid abdomen, largest loops measuring up to 4.0 cm. The distal small bowel is decompressed. Transition point is difficult to identify although suspect it is in the vicinity of the left adnexa (series 2, image 72, series 4, image 45). Vascular/Lymphatic: No significant vascular findings are present. No enlarged abdominal or pelvic lymph nodes. Reproductive: Uterine fibroids.  Other: No abdominal wall hernia or abnormality. Small volume perihepatic ascites. Musculoskeletal: No acute or significant osseous findings. Bilateral pars defects of L5. IMPRESSION: 1. There are numerous loops of distended small bowel in the mid abdomen, largest loops measuring up to 4.0 cm. The distal small bowel is decompressed. Findings are consistent with small bowel obstruction. Transition point is difficult to identify although suspect it is in the vicinity of the left adnexa (series 2, image 72, series 4, image 45). 2.  Small volume perihepatic ascites, of uncertain etiology. 3.  Small right pleural effusion. 4.  Fibroids. Electronically Signed   By: Eddie Candle M.D.   On: 12/25/2018 14:44   Dg Abd 1 View  Result Date: 12/25/2018 CLINICAL DATA:  NG tube placement EXAM: ABDOMEN - 1 VIEW COMPARISON:  None. FINDINGS: NG tube tip and side port project over the stomach. Dilated small bowel in the right lower quadrant. Lung bases are clear. IMPRESSION: NG-tube tip and side port project over the stomach. Electronically Signed   By: Ulyses Jarred M.D.   On: 12/25/2018 19:40        Scheduled Meds: Continuous Infusions:  sodium chloride       LOS: 0 days     Cordelia Poche, MD [facility] 12/26/2018, 9:30 AM  If 7PM-7AM, please contact night-coverage [email or web address]

## 2018-12-26 NOTE — Progress Notes (Signed)
Call made to radiology [phone]) to make them aware that Gastrografin has been given; no answer. Donne Hazel, RN

## 2018-12-27 ENCOUNTER — Inpatient Hospital Stay (HOSPITAL_COMMUNITY): Payer: BLUE CROSS/BLUE SHIELD

## 2018-12-27 LAB — BASIC METABOLIC PANEL
Anion gap: 14 (ref 5–15)
BUN: 27 mg/dL — ABNORMAL HIGH (ref 6–20)
CO2: 29 mmol/L (ref 22–32)
Calcium: 9.5 mg/dL (ref 8.9–10.3)
Chloride: 96 mmol/L — ABNORMAL LOW (ref 98–111)
Creatinine, Ser: 1.5 mg/dL — ABNORMAL HIGH (ref 0.44–1.00)
GFR calc Af Amer: 44 mL/min — ABNORMAL LOW (ref >60)
GFR calc non Af Amer: 38 mL/min — ABNORMAL LOW (ref >60)
Glucose, Bld: 163 mg/dL — ABNORMAL HIGH (ref 70–99)
Potassium: 3.5 mmol/L (ref 3.5–5.1)
Sodium: 139 mmol/L (ref 135–145)

## 2018-12-27 LAB — GLUCOSE, CAPILLARY: Glucose-Capillary: 160 mg/dL — ABNORMAL HIGH (ref 70–99)

## 2018-12-27 MED ORDER — SODIUM CHLORIDE 0.9 % IV SOLN
1000.0000 mL | INTRAVENOUS | Status: DC
  Administered 2018-12-27: 18:00:00 1000 mL via INTRAVENOUS

## 2018-12-27 MED ORDER — SODIUM CHLORIDE 0.9 % IV SOLN
1000.0000 mL | INTRAVENOUS | Status: DC
  Administered 2018-12-27 – 2018-12-28 (×2): 1000 mL via INTRAVENOUS

## 2018-12-27 MED ORDER — MORPHINE SULFATE (PF) 2 MG/ML IV SOLN: 2.0000 mg | INTRAVENOUS | Status: DC | PRN

## 2018-12-27 NOTE — Progress Notes (Signed)
PROGRESS NOTE    Danielle Griffin  ATF:[id number] DOB: 19-Jan-1961 DOA: 12/25/2018 PCP: [facility]   Brief Narrative: Danielle Griffin is a 58 y.o. female with medical history significant of with past medical history of essential hypertension. She presented secondary to abdominal pain and found to have an SBO. NG tube placed.   Assessment & Plan:   Principal Problem:   Abdominal pain Active Problems:   Essential hypertension   Hypokalemia   AKI (acute kidney injury) ([place])   Small bowel obstruction Seen on CT. Possible transition point in area of left adnexa. NG tube placed with 1.5 L out since admission (likely including oral contrast from CT). NG output of 2.25 L over the last 24 hours; received gastrografin for study. Soft bowel movement overnight. -General surgery recommendations: clamp NG, clear liquids  Acute kidney injury Unknown baseline. Creatinine of 2.4 on admission. Given IV fluids with improvement of creatinine to 1.4 today. -IV fluids  Essential hypertension On hydrochlorothiazide and olmesartan as an outpatient. Held secondary to AKI and dehydration -Continue Hydralazine prn   DVT prophylaxis: SCDs Code Status:   Code Status: Full Code Family Communication: None Disposition Plan: Discharge pending improvement of SBO and general surgery recommendations   Consultants:   General surgery  Procedures:   NG tube (4/30>>  Antimicrobials:  None    Subjective: Bowel movement overnight.  Objective: Vitals:   12/26/18 1508 12/26/18 2117 12/27/18 0521 12/27/18 0641  BP: 128/87 130/83 105/64 113/86  Pulse: 94 100 (!) 103 (!) 102  Resp: 16 19 19    Temp:  98.5 F (36.9 C) 99.2 F (37.3 C)   TempSrc:  Oral Oral   SpO2:  93% 93% (!) 88%  Weight:   98.7 kg   Height:        Intake/Output Summary (Last 24 hours) at 12/27/2018 1103 Last data filed at 12/27/2018 1026 Gross per 24 hour  Intake 1400 ml  Output 2000 ml  Net -600 ml    Filed Weights   12/25/18 0651 12/26/18 0500 12/27/18 0521  Weight: 108.9 kg 99.8 kg 98.7 kg    Examination:  General exam: Appears calm and comfortable Respiratory system: Clear to auscultation. Respiratory effort normal. Cardiovascular system: S1 & S2 heard, RRR. No murmurs, rubs, gallops or clicks. Gastrointestinal system: Abdomen is distended, soft and nontender. No organomegaly or masses felt. Normal bowel sounds heard. Central nervous system: Alert and oriented. No focal neurological deficits. Extremities: No edema. No calf tenderness Skin: No cyanosis. No rashes Psychiatry: Judgement and insight appear normal. Mood & affect appropriate.      Data Reviewed: I have personally reviewed following labs and imaging studies  CBC: Recent Labs  Lab 12/25/18 0751 12/26/18 0334  WBC 5.6 4.8  NEUTROABS 3.8  --   HGB 14.6 13.9  HCT 46.5* 43.7  MCV 95.7 97.3  PLT 278 270   Basic Metabolic Panel: Recent Labs  Lab 12/25/18 0751 12/26/18 0334  NA 139 140  K 3.6 3.5  CL 100 103  CO2 26 25  GLUCOSE 146* 138*  BUN 37* 31*  CREATININE 2.40* 1.40*  CALCIUM 9.2 8.6*   GFR: Estimated Creatinine Clearance: 50.3 mL/min (A) (by C-G formula based on SCr of 1.4 mg/dL (H)). Liver Function Tests: Recent Labs  Lab 12/25/18 0751 12/26/18 0334  AST 25 22  ALT 17 15  ALKPHOS 66 59  BILITOT 1.0 1.1  PROT 8.2* 6.8  ALBUMIN 4.6 3.8   Recent Labs  Lab  12/25/18 0751  LIPASE 26   No results for input(s): AMMONIA in the last 168 hours. Coagulation Profile: No results for input(s): INR, PROTIME in the last 168 hours. Cardiac Enzymes: No results for input(s): CKTOTAL, CKMB, CKMBINDEX, TROPONINI in the last 168 hours. BNP (last 3 results) No results for input(s): PROBNP in the last 8760 hours. HbA1C: No results for input(s): HGBA1C in the last 72 hours. CBG: Recent Labs  Lab 12/26/18 0746 12/27/18 0854  GLUCAP 129* 160*   Lipid Profile: No results for input(s): CHOL, HDL,  LDLCALC, TRIG, CHOLHDL, LDLDIRECT in the last 72 hours. Thyroid Function Tests: No results for input(s): TSH, T4TOTAL, FREET4, T3FREE, THYROIDAB in the last 72 hours. Anemia Panel: No results for input(s): VITAMINB12, FOLATE, FERRITIN, TIBC, IRON, RETICCTPCT in the last 72 hours. Sepsis Labs: No results for input(s): PROCALCITON, LATICACIDVEN in the last 168 hours.  No results found for this or any previous visit (from the past 240 hour(s)).       Radiology Studies: Ct Abdomen Pelvis Wo Contrast  Result Date: 12/25/2018 CLINICAL DATA:  Abdominal pain, gastroenteritis or colitis suspected EXAM: CT ABDOMEN AND PELVIS WITHOUT CONTRAST TECHNIQUE: Multidetector CT imaging of the abdomen and pelvis was performed following the standard protocol without IV contrast. Oral enteric contrast was administered. COMPARISON:  None. FINDINGS: Lower chest: Small right pleural effusion with extensive bibasilar scarring and/or atelectasis. Hepatobiliary: No focal liver abnormality is seen. No gallstones, gallbladder wall thickening, or biliary dilatation. Pancreas: Unremarkable. No pancreatic ductal dilatation or surrounding inflammatory changes. Spleen: Normal in size without focal abnormality. Adrenals/Urinary Tract: Adrenal glands are unremarkable. Kidneys are normal, without renal calculi, focal lesion, or hydronephrosis. Bladder is unremarkable. Stomach/Bowel: Stomach is within normal limits. Appendix appears normal. There are numerous loops of distended small bowel in the mid abdomen, largest loops measuring up to 4.0 cm. The distal small bowel is decompressed. Transition point is difficult to identify although suspect it is in the vicinity of the left adnexa (series 2, image 72, series 4, image 45). Vascular/Lymphatic: No significant vascular findings are present. No enlarged abdominal or pelvic lymph nodes. Reproductive: Uterine fibroids. Other: No abdominal wall hernia or abnormality. Small volume perihepatic  ascites. Musculoskeletal: No acute or significant osseous findings. Bilateral pars defects of L5. IMPRESSION: 1. There are numerous loops of distended small bowel in the mid abdomen, largest loops measuring up to 4.0 cm. The distal small bowel is decompressed. Findings are consistent with small bowel obstruction. Transition point is difficult to identify although suspect it is in the vicinity of the left adnexa (series 2, image 72, series 4, image 45). 2.  Small volume perihepatic ascites, of uncertain etiology. 3.  Small right pleural effusion. 4.  Fibroids. Electronically Signed   By: Eddie Candle M.D.   On: 12/25/2018 14:44   Dg Abd 1 View  Result Date: 12/25/2018 CLINICAL DATA:  NG tube placement EXAM: ABDOMEN - 1 VIEW COMPARISON:  None. FINDINGS: NG tube tip and side port project over the stomach. Dilated small bowel in the right lower quadrant. Lung bases are clear. IMPRESSION: NG-tube tip and side port project over the stomach. Electronically Signed   By: Ulyses Jarred M.D.   On: 12/25/2018 19:40   Dg Abd Portable 1v-small Bowel Obstruction Protocol-initial, 8 Hr Delay  Result Date: 12/26/2018 CLINICAL DATA:  Small bowel obstruction EXAM: PORTABLE ABDOMEN - 1 VIEW COMPARISON:  12/26/2018 FINDINGS: Continued dilated small bowel loops in the abdomen compatible with small bowel obstruction. Degree of distention may have  increased since prior study. NG tube tip is seen in the proximal stomach, unchanged. IMPRESSION: Continued small bowel dilatation, slightly worsened since prior study compatible with small bowel obstruction. Electronically Signed   By: Rolm Baptise M.D.   On: 12/26/2018 23:45   Dg Abd Portable 1v  Result Date: 12/26/2018 CLINICAL DATA:  Small bowel obstruction. EXAM: PORTABLE ABDOMEN - 1 VIEW COMPARISON:  Abdominal x-ray from yesterday. FINDINGS: NG tube tip in the stomach. Mildly dilated loops of small bowel in the central and right abdomen are unchanged. No acute osseous abnormality.  IMPRESSION: 1. Unchanged small bowel obstruction. Electronically Signed   By: Titus Dubin M.D.   On: 12/26/2018 10:27        Scheduled Meds: . lip balm  1 application Topical BID   Continuous Infusions: . sodium chloride    . methocarbamol (ROBAXIN) IV    . ondansetron (ZOFRAN) IV       LOS: 1 day     Cordelia Poche, MD [facility] 12/27/2018, 11:03 AM  If 7PM-7AM, please contact night-coverage [email or web address]

## 2018-12-27 NOTE — Progress Notes (Signed)
Patient c/o of bloating and stated she had belched up some brown liquid. NG tube has been clamped since 10 am per Jonelle Sidle.  Pt c/o of some palpitations and so I flushed the NG tube changed out the filter and hooked back up to LIW suction and received 250 mls of brown bile within 10 minutes. Pt felt some relief from this and heart palpitations decreased. CCS paged and orders were put in and told to keep on LIW suction for tonight.

## 2018-12-27 NOTE — Progress Notes (Signed)
Assessment & Plan: Acute kidney disease secondary to dehydration Hypertension Gout  Abdominal pain, diarrhea, nausea and vomiting Gastroenteritis VS small bowel obstruction Hx tubal ligation/Myomectomy will by laparotomy   Clamp NG tube today  Begin trial clear liquid diet  AXR today  Encouraged ambulation in halls        Armandina Gemma, MD       [facility]       Office: [phone]   Chief Complaint: Enteritis vs small bowel obstruction  Subjective: Patient in bed, states she feels better.  Wants to eat.  Soft BM overnight.  Denies nausea or emesis.  Objective: Vital signs in last 24 hours: Temp:  [98.5 F (36.9 C)-99.2 F (37.3 C)] 99.2 F (37.3 C) (05/02 0521) Pulse Rate:  [94-103] 102 (05/02 0641) Resp:  [16-19] 19 (05/02 0521) BP: (105-130)/(64-87) 113/86 (05/02 0641) SpO2:  [88 %-93 %] 88 % (05/02 0641) Weight:  [98.7 kg] 98.7 kg (05/02 0521) Last BM Date: 12/26/18  Intake/Output from previous day: 05/01 0701 - 05/02 0700 In: 1680 [P.O.:480; I.V.:1200] Out: 2250 [Emesis/NG output:2250] Intake/Output this shift: No intake/output data recorded.  Physical Exam: HEENT - sclerae clear, mucous membranes moist Neck - soft Chest - clear bilaterally Cor - RRR Abdomen - soft, mild distension, few BS present; non-tender; NG with bilious output Ext - no edema, non-tender Neuro - alert & oriented, no focal deficits  Lab Results:  Recent Labs    12/25/18 0751 12/26/18 0334  WBC 5.6 4.8  HGB 14.6 13.9  HCT 46.5* 43.7  PLT 278 230   BMET Recent Labs    12/25/18 0751 12/26/18 0334  NA 139 140  K 3.6 3.5  CL 100 103  CO2 26 25  GLUCOSE 146* 138*  BUN 37* 31*  CREATININE 2.40* 1.40*  CALCIUM 9.2 8.6*   PT/INR No results for input(s): LABPROT, INR in the last 72 hours. Comprehensive Metabolic Panel:    Component Value Date/Time   NA 140 12/26/2018 0334   NA 139 12/25/2018 0751   K 3.5 12/26/2018 0334   K 3.6 12/25/2018  0751   CL 103 12/26/2018 0334   CL 100 12/25/2018 0751   CO2 25 12/26/2018 0334   CO2 26 12/25/2018 0751   BUN 31 (H) 12/26/2018 0334   BUN 37 (H) 12/25/2018 0751   CREATININE 1.40 (H) 12/26/2018 0334   CREATININE 2.40 (H) 12/25/2018 0751   GLUCOSE 138 (H) 12/26/2018 0334   GLUCOSE 146 (H) 12/25/2018 0751   CALCIUM 8.6 (L) 12/26/2018 0334   CALCIUM 9.2 12/25/2018 0751   AST 22 12/26/2018 0334   AST 25 12/25/2018 0751   ALT 15 12/26/2018 0334   ALT 17 12/25/2018 0751   ALKPHOS 59 12/26/2018 0334   ALKPHOS 66 12/25/2018 0751   BILITOT 1.1 12/26/2018 0334   BILITOT 1.0 12/25/2018 0751   PROT 6.8 12/26/2018 0334   PROT 8.2 (H) 12/25/2018 0751   ALBUMIN 3.8 12/26/2018 0334   ALBUMIN 4.6 12/25/2018 0751    Studies/Results: Ct Abdomen Pelvis Wo Contrast  Result Date: 12/25/2018 CLINICAL DATA:  Abdominal pain, gastroenteritis or colitis suspected EXAM: CT ABDOMEN AND PELVIS WITHOUT CONTRAST TECHNIQUE: Multidetector CT imaging of the abdomen and pelvis was performed following the standard protocol without IV contrast. Oral enteric contrast was administered. COMPARISON:  None. FINDINGS: Lower chest: Small right pleural effusion with extensive bibasilar scarring and/or atelectasis. Hepatobiliary: No focal liver abnormality is seen. No gallstones, gallbladder wall thickening, or biliary dilatation.  Pancreas: Unremarkable. No pancreatic ductal dilatation or surrounding inflammatory changes. Spleen: Normal in size without focal abnormality. Adrenals/Urinary Tract: Adrenal glands are unremarkable. Kidneys are normal, without renal calculi, focal lesion, or hydronephrosis. Bladder is unremarkable. Stomach/Bowel: Stomach is within normal limits. Appendix appears normal. There are numerous loops of distended small bowel in the mid abdomen, largest loops measuring up to 4.0 cm. The distal small bowel is decompressed. Transition point is difficult to identify although suspect it is in the vicinity of the  left adnexa (series 2, image 72, series 4, image 45). Vascular/Lymphatic: No significant vascular findings are present. No enlarged abdominal or pelvic lymph nodes. Reproductive: Uterine fibroids. Other: No abdominal wall hernia or abnormality. Small volume perihepatic ascites. Musculoskeletal: No acute or significant osseous findings. Bilateral pars defects of L5. IMPRESSION: 1. There are numerous loops of distended small bowel in the mid abdomen, largest loops measuring up to 4.0 cm. The distal small bowel is decompressed. Findings are consistent with small bowel obstruction. Transition point is difficult to identify although suspect it is in the vicinity of the left adnexa (series 2, image 72, series 4, image 45). 2.  Small volume perihepatic ascites, of uncertain etiology. 3.  Small right pleural effusion. 4.  Fibroids. Electronically Signed   By: Eddie Candle M.D.   On: 12/25/2018 14:44   Dg Abd 1 View  Result Date: 12/25/2018 CLINICAL DATA:  NG tube placement EXAM: ABDOMEN - 1 VIEW COMPARISON:  None. FINDINGS: NG tube tip and side port project over the stomach. Dilated small bowel in the right lower quadrant. Lung bases are clear. IMPRESSION: NG-tube tip and side port project over the stomach. Electronically Signed   By: Ulyses Jarred M.D.   On: 12/25/2018 19:40   Dg Abd Portable 1v-small Bowel Obstruction Protocol-initial, 8 Hr Delay  Result Date: 12/26/2018 CLINICAL DATA:  Small bowel obstruction EXAM: PORTABLE ABDOMEN - 1 VIEW COMPARISON:  12/26/2018 FINDINGS: Continued dilated small bowel loops in the abdomen compatible with small bowel obstruction. Degree of distention may have increased since prior study. NG tube tip is seen in the proximal stomach, unchanged. IMPRESSION: Continued small bowel dilatation, slightly worsened since prior study compatible with small bowel obstruction. Electronically Signed   By: Rolm Baptise M.D.   On: 12/26/2018 23:45   Dg Abd Portable 1v  Result Date: 12/26/2018  CLINICAL DATA:  Small bowel obstruction. EXAM: PORTABLE ABDOMEN - 1 VIEW COMPARISON:  Abdominal x-ray from yesterday. FINDINGS: NG tube tip in the stomach. Mildly dilated loops of small bowel in the central and right abdomen are unchanged. No acute osseous abnormality. IMPRESSION: 1. Unchanged small bowel obstruction. Electronically Signed   By: Titus Dubin M.D.   On: 12/26/2018 10:27      Armandina Gemma 12/27/2018  Patient ID: Danielle Griffin, female   DOB: 1960/10/16, 58 y.o.   MRN: [id number]

## 2018-12-28 LAB — GLUCOSE, CAPILLARY: Glucose-Capillary: 142 mg/dL — ABNORMAL HIGH (ref 70–99)

## 2018-12-28 LAB — BASIC METABOLIC PANEL
Anion gap: 14 (ref 5–15)
BUN: 29 mg/dL — ABNORMAL HIGH (ref 6–20)
CO2: 32 mmol/L (ref 22–32)
Calcium: 9.4 mg/dL (ref 8.9–10.3)
Chloride: 95 mmol/L — ABNORMAL LOW (ref 98–111)
Creatinine, Ser: 1.49 mg/dL — ABNORMAL HIGH (ref 0.44–1.00)
GFR calc Af Amer: 44 mL/min — ABNORMAL LOW (ref >60)
GFR calc non Af Amer: 38 mL/min — ABNORMAL LOW (ref >60)
Glucose, Bld: 123 mg/dL — ABNORMAL HIGH (ref 70–99)
Potassium: 2.9 mmol/L — ABNORMAL LOW (ref 3.5–5.1)
Sodium: 141 mmol/L (ref 135–145)

## 2018-12-28 MED ORDER — SODIUM CHLORIDE 0.9% FLUSH: 10.0000 mL | INTRAVENOUS | Status: DC | PRN

## 2018-12-28 MED ORDER — KCL IN DEXTROSE-NACL 20-5-0.45 MEQ/L-%-% IV SOLN
INTRAVENOUS | Status: DC
  Administered 2018-12-28 – 2018-12-29 (×3): via INTRAVENOUS
  Filled 2018-12-28 (×2): qty 1000

## 2018-12-28 NOTE — Plan of Care (Signed)

## 2018-12-28 NOTE — Progress Notes (Addendum)
PROGRESS NOTE    Danielle Griffin  OQH:[id number] DOB: 12-14-1960 DOA: 12/25/2018 PCP: [facility]   Brief Narrative: Danielle Griffin is a 58 y.o. female with medical history significant of with past medical history of essential hypertension. She presented secondary to abdominal pain and found to have an SBO. NG tube placed.   Assessment & Plan:   Principal Problem:   Abdominal pain Active Problems:   Essential hypertension   Hypokalemia   AKI (acute kidney injury) ([place])   Small bowel obstruction Seen on CT. Possible transition point in area of left adnexa. NG tube placed with 1.5 L out since admission (likely including oral contrast from CT). NG output of 650 mL over last 24 hours but no document output in last 12 hours. Failed clamp trial. Minimal BM with suppository -General surgery recommendations: NG back to suction, NPO, ambulate  Acute kidney injury Unknown baseline. Creatinine of 2.4 on admission. Given IV fluids with improvement of creatinine to 1.4 but trended back up yesterday. BMP pending today. -D5 1/2 NS IV fluids  Essential hypertension On hydrochlorothiazide and olmesartan as an outpatient. Held secondary to AKI and dehydration -Continue Hydralazine prn   DVT prophylaxis: SCDs Code Status:   Code Status: Full Code Family Communication: None Disposition Plan: Discharge pending improvement of SBO and general surgery recommendations   Consultants:   General surgery  Procedures:   NG tube (4/30>>  Antimicrobials:  None    Subjective: Emesis yesterday with clamp trial and clear liquids  Objective: Vitals:   12/27/18 1404 12/27/18 1728 12/27/18 2139 12/28/18 0458  BP: 124/84 123/80 116/85 131/77  Pulse: (!) 104 (!) 102 (!) 114 (!) 108  Resp: 16  18 16   Temp: 99 F (37.2 C)  98.4 F (36.9 C) 99.1 F (37.3 C)  TempSrc: Oral  Oral Oral  SpO2: 94% 96% 93% 93%  Weight:      Height:        Intake/Output Summary (Last 24  hours) at 12/28/2018 1002 Last data filed at 12/28/2018 0600 Gross per 24 hour  Intake 1915.51 ml  Output 1400 ml  Net 515.51 ml   Filed Weights   12/25/18 0651 12/26/18 0500 12/27/18 0521  Weight: 108.9 kg 99.8 kg 98.7 kg    Examination:  General exam: Appears calm and comfortable Respiratory system: Clear to auscultation. Respiratory effort normal. Cardiovascular system: S1 & S2 heard, RRR. No murmurs, rubs, gallops or clicks. Gastrointestinal system: Abdomen is distended, soft and nontender. No organomegaly or masses felt. Minimal bowel sounds heard. Central nervous system: Alert and oriented. No focal neurological deficits. Extremities: No edema. No calf tenderness Skin: No cyanosis. No rashes Psychiatry: Judgement and insight appear normal. Mood & affect appropriate.      Data Reviewed: I have personally reviewed following labs and imaging studies  CBC: Recent Labs  Lab 12/25/18 0751 12/26/18 0334  WBC 5.6 4.8  NEUTROABS 3.8  --   HGB 14.6 13.9  HCT 46.5* 43.7  MCV 95.7 97.3  PLT 278 546   Basic Metabolic Panel: Recent Labs  Lab 12/25/18 0751 12/26/18 0334 12/27/18 1300  NA 139 140 139  K 3.6 3.5 3.5  CL 100 103 96*  CO2 26 25 29   GLUCOSE 146* 138* 163*  BUN 37* 31* 27*  CREATININE 2.40* 1.40* 1.50*  CALCIUM 9.2 8.6* 9.5   GFR: Estimated Creatinine Clearance: 47 mL/min (A) (by C-G formula based on SCr of 1.5 mg/dL (H)). Liver Function Tests: Recent Labs  Lab 12/25/18 0751 12/26/18 0334  AST 25 22  ALT 17 15  ALKPHOS 66 59  BILITOT 1.0 1.1  PROT 8.2* 6.8  ALBUMIN 4.6 3.8   Recent Labs  Lab 12/25/18 0751  LIPASE 26   No results for input(s): AMMONIA in the last 168 hours. Coagulation Profile: No results for input(s): INR, PROTIME in the last 168 hours. Cardiac Enzymes: No results for input(s): CKTOTAL, CKMB, CKMBINDEX, TROPONINI in the last 168 hours. BNP (last 3 results) No results for input(s): PROBNP in the last 8760 hours. HbA1C: No  results for input(s): HGBA1C in the last 72 hours. CBG: Recent Labs  Lab 12/26/18 0746 12/27/18 0854 12/28/18 0731  GLUCAP 129* 160* 142*   Lipid Profile: No results for input(s): CHOL, HDL, LDLCALC, TRIG, CHOLHDL, LDLDIRECT in the last 72 hours. Thyroid Function Tests: No results for input(s): TSH, T4TOTAL, FREET4, T3FREE, THYROIDAB in the last 72 hours. Anemia Panel: No results for input(s): VITAMINB12, FOLATE, FERRITIN, TIBC, IRON, RETICCTPCT in the last 72 hours. Sepsis Labs: No results for input(s): PROCALCITON, LATICACIDVEN in the last 168 hours.  No results found for this or any previous visit (from the past 240 hour(s)).       Radiology Studies: Dg Abd 2 Views  Result Date: 12/27/2018 CLINICAL DATA:  Small bowel obstruction. EXAM: ABDOMEN - 2 VIEW COMPARISON:  Dec 26, 2018 FINDINGS: Continued small bowel obstruction with dilated loops of small bowel, some of which demonstrate air-fluid levels. The distal tip of the NG tube is in the fundus with the side port just below the GE junction. IMPRESSION: 1. Continued small bowel obstruction. 2. The side port of the NG tube is just below the GE junction with the distal tip in the fundus. Electronically Signed   By: Dorise Bullion III M.D   On: 12/27/2018 21:10   Dg Abd Portable 1v-small Bowel Obstruction Protocol-initial, 8 Hr Delay  Result Date: 12/26/2018 CLINICAL DATA:  Small bowel obstruction EXAM: PORTABLE ABDOMEN - 1 VIEW COMPARISON:  12/26/2018 FINDINGS: Continued dilated small bowel loops in the abdomen compatible with small bowel obstruction. Degree of distention may have increased since prior study. NG tube tip is seen in the proximal stomach, unchanged. IMPRESSION: Continued small bowel dilatation, slightly worsened since prior study compatible with small bowel obstruction. Electronically Signed   By: Rolm Baptise M.D.   On: 12/26/2018 23:45   Dg Abd Portable 1v  Result Date: 12/26/2018 CLINICAL DATA:  Small bowel  obstruction. EXAM: PORTABLE ABDOMEN - 1 VIEW COMPARISON:  Abdominal x-ray from yesterday. FINDINGS: NG tube tip in the stomach. Mildly dilated loops of small bowel in the central and right abdomen are unchanged. No acute osseous abnormality. IMPRESSION: 1. Unchanged small bowel obstruction. Electronically Signed   By: Titus Dubin M.D.   On: 12/26/2018 10:27        Scheduled Meds: . lip balm  1 application Topical BID   Continuous Infusions: . dextrose 5 % and 0.45 % NaCl with KCl 20 mEq/L    . methocarbamol (ROBAXIN) IV 100 mL/hr at 12/27/18 1859  . ondansetron (ZOFRAN) IV       LOS: 2 days     Cordelia Poche, MD [facility] 12/28/2018, 10:02 AM  If 7PM-7AM, please contact night-coverage [email or web address]

## 2018-12-28 NOTE — Progress Notes (Signed)
Assessment & Plan: HD#4 - small bowel obstruction (hx tubal ligation/myomectomy by laparotomy)  Failed clamping trial yesterday - NG re-positioned and back on suction             NPO, IV hydration             AXR in AM 5/4 ordered             Encouraged ambulation in halls  Discussed with patient and husband by telephone        Armandina Gemma, MD       [facility]       Office: [phone]   Chief Complaint: SBO vs gastroenteritis  Subjective: Patient in bed, comfortable.  Small output with enema.  Objective: Vital signs in last 24 hours: Temp:  [98.4 F (36.9 C)-99.1 F (37.3 C)] 99.1 F (37.3 C) (05/03 0458) Pulse Rate:  [102-114] 108 (05/03 0458) Resp:  [16-18] 16 (05/03 0458) BP: (116-131)/(77-85) 131/77 (05/03 0458) SpO2:  [93 %-96 %] 93 % (05/03 0458) Last BM Date: 12/27/18  Intake/Output from previous day: 05/02 0701 - 05/03 0700 In: 1915.5 [P.O.:720; I.V.:1145.5; IV Piggyback:50] Out: 1400 [Urine:750; Emesis/NG output:650] Intake/Output this shift: No intake/output data recorded.  Physical Exam: HEENT - sclerae clear, mucous membranes moist Neck - soft Chest - clear bilaterally Cor - RRR Abdomen - soft, obese, moderate distension; rare BS present; minimal tenderness; NG bilious Ext - no edema, non-tender Neuro - alert & oriented, no focal deficits  Lab Results:  Recent Labs    12/26/18 0334  WBC 4.8  HGB 13.9  HCT 43.7  PLT 230   BMET Recent Labs    12/26/18 0334 12/27/18 1300  NA 140 139  K 3.5 3.5  CL 103 96*  CO2 25 29  GLUCOSE 138* 163*  BUN 31* 27*  CREATININE 1.40* 1.50*  CALCIUM 8.6* 9.5   PT/INR No results for input(s): LABPROT, INR in the last 72 hours. Comprehensive Metabolic Panel:    Component Value Date/Time   NA 139 12/27/2018 1300   NA 140 12/26/2018 0334   K 3.5 12/27/2018 1300   K 3.5 12/26/2018 0334   CL 96 (L) 12/27/2018 1300   CL 103 12/26/2018 0334   CO2 29 12/27/2018 1300   CO2 25  12/26/2018 0334   BUN 27 (H) 12/27/2018 1300   BUN 31 (H) 12/26/2018 0334   CREATININE 1.50 (H) 12/27/2018 1300   CREATININE 1.40 (H) 12/26/2018 0334   GLUCOSE 163 (H) 12/27/2018 1300   GLUCOSE 138 (H) 12/26/2018 0334   CALCIUM 9.5 12/27/2018 1300   CALCIUM 8.6 (L) 12/26/2018 0334   AST 22 12/26/2018 0334   AST 25 12/25/2018 0751   ALT 15 12/26/2018 0334   ALT 17 12/25/2018 0751   ALKPHOS 59 12/26/2018 0334   ALKPHOS 66 12/25/2018 0751   BILITOT 1.1 12/26/2018 0334   BILITOT 1.0 12/25/2018 0751   PROT 6.8 12/26/2018 0334   PROT 8.2 (H) 12/25/2018 0751   ALBUMIN 3.8 12/26/2018 0334   ALBUMIN 4.6 12/25/2018 0751    Studies/Results: Dg Abd 2 Views  Result Date: 12/27/2018 CLINICAL DATA:  Small bowel obstruction. EXAM: ABDOMEN - 2 VIEW COMPARISON:  Dec 26, 2018 FINDINGS: Continued small bowel obstruction with dilated loops of small bowel, some of which demonstrate air-fluid levels. The distal tip of the NG tube is in the fundus with the side port just below the GE junction. IMPRESSION: 1. Continued small bowel obstruction. 2. The side  port of the NG tube is just below the GE junction with the distal tip in the fundus. Electronically Signed   By: Dorise Bullion III M.D   On: 12/27/2018 21:10   Dg Abd Portable 1v-small Bowel Obstruction Protocol-initial, 8 Hr Delay  Result Date: 12/26/2018 CLINICAL DATA:  Small bowel obstruction EXAM: PORTABLE ABDOMEN - 1 VIEW COMPARISON:  12/26/2018 FINDINGS: Continued dilated small bowel loops in the abdomen compatible with small bowel obstruction. Degree of distention may have increased since prior study. NG tube tip is seen in the proximal stomach, unchanged. IMPRESSION: Continued small bowel dilatation, slightly worsened since prior study compatible with small bowel obstruction. Electronically Signed   By: Rolm Baptise M.D.   On: 12/26/2018 23:45   Dg Abd Portable 1v  Result Date: 12/26/2018 CLINICAL DATA:  Small bowel obstruction. EXAM: PORTABLE  ABDOMEN - 1 VIEW COMPARISON:  Abdominal x-ray from yesterday. FINDINGS: NG tube tip in the stomach. Mildly dilated loops of small bowel in the central and right abdomen are unchanged. No acute osseous abnormality. IMPRESSION: 1. Unchanged small bowel obstruction. Electronically Signed   By: Titus Dubin M.D.   On: 12/26/2018 10:27      Armandina Gemma 12/28/2018  Patient ID: Rexene Edison, female   DOB: [birth year], 58 y.o.   MRN: [id number]

## 2018-12-28 NOTE — Progress Notes (Signed)
Spoke with Dr. Catalina Antigua about Abd xray in which he said to advance NG tube 4 to 6 inches. It was done patient tolerated it fair.

## 2018-12-29 ENCOUNTER — Inpatient Hospital Stay (HOSPITAL_COMMUNITY): Payer: BLUE CROSS/BLUE SHIELD

## 2018-12-29 LAB — CBC
HCT: 40.5 % (ref 36.0–46.0)
Hemoglobin: 12.7 g/dL (ref 12.0–15.0)
MCH: 30 pg (ref 26.0–34.0)
MCHC: 31.4 g/dL (ref 30.0–36.0)
MCV: 95.7 fL (ref 80.0–100.0)
Platelets: 257 K/uL (ref 150–400)
RBC: 4.23 MIL/uL (ref 3.87–5.11)
RDW: 13.6 % (ref 11.5–15.5)
WBC: 7.3 K/uL (ref 4.0–10.5)
nRBC: 0 % (ref 0.0–0.2)

## 2018-12-29 LAB — BASIC METABOLIC PANEL
Anion gap: 10 (ref 5–15)
BUN: 26 mg/dL — ABNORMAL HIGH (ref 6–20)
CO2: 31 mmol/L (ref 22–32)
Calcium: 9 mg/dL (ref 8.9–10.3)
Chloride: 97 mmol/L — ABNORMAL LOW (ref 98–111)
Creatinine, Ser: 1.26 mg/dL — ABNORMAL HIGH (ref 0.44–1.00)
GFR calc Af Amer: 54 mL/min — ABNORMAL LOW (ref >60)
GFR calc non Af Amer: 47 mL/min — ABNORMAL LOW (ref >60)
Glucose, Bld: 147 mg/dL — ABNORMAL HIGH (ref 70–99)
Potassium: 3 mmol/L — ABNORMAL LOW (ref 3.5–5.1)
Sodium: 138 mmol/L (ref 135–145)

## 2018-12-29 LAB — POTASSIUM: Potassium: 3 mmol/L — ABNORMAL LOW (ref 3.5–5.1)

## 2018-12-29 LAB — GLUCOSE, CAPILLARY: Glucose-Capillary: 92 mg/dL (ref 70–99)

## 2018-12-29 MED ORDER — ENOXAPARIN SODIUM 40 MG/0.4ML ~~LOC~~ SOLN
40.0000 mg | SUBCUTANEOUS | Status: DC
  Administered 2018-12-29 – 2019-01-05 (×8): 40 mg via SUBCUTANEOUS
  Filled 2018-12-29 (×8): qty 0.4

## 2018-12-29 MED ORDER — KCL IN DEXTROSE-NACL 40-5-0.45 MEQ/L-%-% IV SOLN
INTRAVENOUS | Status: DC
  Administered 2018-12-29 – 2018-12-30 (×3): via INTRAVENOUS
  Filled 2018-12-29 (×3): qty 1000

## 2018-12-29 MED ORDER — KCL IN DEXTROSE-NACL 40-5-0.45 MEQ/L-%-% IV SOLN
Freq: Once | INTRAVENOUS | Status: AC
  Administered 2018-12-29: 09:00:00 via INTRAVENOUS
  Filled 2018-12-29: qty 1000

## 2018-12-29 NOTE — Progress Notes (Signed)
PROGRESS NOTE    Danielle Griffin  ZJI:[id number] DOB: 1961/04/08 DOA: 12/25/2018 PCP: [facility]   Brief Narrative: Danielle Griffin is a 58 y.o. female with medical history significant of with past medical history of essential hypertension. She presented secondary to abdominal pain and found to have an SBO. NG tube placed.   Assessment & Plan:   Principal Problem:   Abdominal pain Active Problems:   Essential hypertension   Hypokalemia   AKI (acute kidney injury) ([place])   Small bowel obstruction Seen on CT. Possible transition point in area of left adnexa. NG tube placed with 1.5 L out since admission (likely including oral contrast from CT). NG output of 2000 mL over last 24 hours. Failed clamp trial. Minimal BM with suppository. NG tube came out today accidentally. Does not appear to be progressing well, unfortunately -General surgery recommendations: trial today, possible surgery if does not progress  Acute kidney injury Unknown baseline. Creatinine of 2.4 on admission. Given IV fluids with improvement of creatinine to 1.4 but trended back up yesterday. BMP pending today. -D5 1/2 NS IV fluids  Essential hypertension On hydrochlorothiazide and olmesartan as an outpatient. Held secondary to AKI and dehydration -Continue Hydralazine prn  Hypokalemia -Potassium added to IV fluids   DVT prophylaxis: SCDs Code Status:   Code Status: Full Code Family Communication: None Disposition Plan: Discharge pending improvement of SBO and general surgery recommendations   Consultants:   General surgery  Procedures:   NG tube (4/30>>5/4)  Antimicrobials:  None    Subjective: No bowel movement. Passing gas.  Objective: Vitals:   12/28/18 1342 12/28/18 2141 12/29/18 0501 12/29/18 0600  BP: 120/87 120/85 128/89   Pulse: (!) 120 (!) 107 (!) 106   Resp: 18 18 18    Temp: 99.6 F (37.6 C) 99 F (37.2 C) 99.1 F (37.3 C)   TempSrc: Oral Oral Oral    SpO2: 90% (!) 89% 95%   Weight:    100 kg  Height:        Intake/Output Summary (Last 24 hours) at 12/29/2018 1056 Last data filed at 12/29/2018 0520 Gross per 24 hour  Intake 1730.52 ml  Output 2000 ml  Net -269.48 ml   Filed Weights   12/26/18 0500 12/27/18 0521 12/29/18 0600  Weight: 99.8 kg 98.7 kg 100 kg    Examination:  General exam: Appears calm and comfortable Respiratory system: Clear to auscultation. Respiratory effort normal. Cardiovascular system: S1 & S2 heard, RRR. No murmurs, rubs, gallops or clicks. Gastrointestinal system: Abdomen is nondistended, soft and nontender. No organomegaly or masses felt. Decreased bowel sounds heard on left side with non heard on right. Central nervous system: Alert and oriented. No focal neurological deficits. Extremities: No edema. No calf tenderness Skin: No cyanosis. No rashes Psychiatry: Judgement and insight appear normal. Mood & affect appropriate.      Data Reviewed: I have personally reviewed following labs and imaging studies  CBC: Recent Labs  Lab 12/25/18 0751 12/26/18 0334 12/29/18 0349  WBC 5.6 4.8 7.3  NEUTROABS 3.8  --   --   HGB 14.6 13.9 12.7  HCT 46.5* 43.7 40.5  MCV 95.7 97.3 95.7  PLT 278 230 175   Basic Metabolic Panel: Recent Labs  Lab 12/25/18 0751 12/26/18 0334 12/27/18 1300 12/28/18 0858 12/29/18 0349  NA 139 140 139 141 138  K 3.6 3.5 3.5 2.9* 3.0*  CL 100 103 96* 95* 97*  CO2 26 25 29  32 31  GLUCOSE 146* 138* 163* 123* 147*  BUN 37* 31* 27* 29* 26*  CREATININE 2.40* 1.40* 1.50* 1.49* 1.26*  CALCIUM 9.2 8.6* 9.5 9.4 9.0   GFR: Estimated Creatinine Clearance: 56.3 mL/min (A) (by C-G formula based on SCr of 1.26 mg/dL (H)). Liver Function Tests: Recent Labs  Lab 12/25/18 0751 12/26/18 0334  AST 25 22  ALT 17 15  ALKPHOS 66 59  BILITOT 1.0 1.1  PROT 8.2* 6.8  ALBUMIN 4.6 3.8   Recent Labs  Lab 12/25/18 0751  LIPASE 26   No results for input(s): AMMONIA in the last 168  hours. Coagulation Profile: No results for input(s): INR, PROTIME in the last 168 hours. Cardiac Enzymes: No results for input(s): CKTOTAL, CKMB, CKMBINDEX, TROPONINI in the last 168 hours. BNP (last 3 results) No results for input(s): PROBNP in the last 8760 hours. HbA1C: No results for input(s): HGBA1C in the last 72 hours. CBG: Recent Labs  Lab 12/26/18 0746 12/27/18 0854 12/28/18 0731 12/29/18 0724  GLUCAP 129* 160* 142* 92   Lipid Profile: No results for input(s): CHOL, HDL, LDLCALC, TRIG, CHOLHDL, LDLDIRECT in the last 72 hours. Thyroid Function Tests: No results for input(s): TSH, T4TOTAL, FREET4, T3FREE, THYROIDAB in the last 72 hours. Anemia Panel: No results for input(s): VITAMINB12, FOLATE, FERRITIN, TIBC, IRON, RETICCTPCT in the last 72 hours. Sepsis Labs: No results for input(s): PROCALCITON, LATICACIDVEN in the last 168 hours.  No results found for this or any previous visit (from the past 240 hour(s)).       Radiology Studies: Dg Abd 2 Views  Result Date: 12/27/2018 CLINICAL DATA:  Small bowel obstruction. EXAM: ABDOMEN - 2 VIEW COMPARISON:  Dec 26, 2018 FINDINGS: Continued small bowel obstruction with dilated loops of small bowel, some of which demonstrate air-fluid levels. The distal tip of the NG tube is in the fundus with the side port just below the GE junction. IMPRESSION: 1. Continued small bowel obstruction. 2. The side port of the NG tube is just below the GE junction with the distal tip in the fundus. Electronically Signed   By: Dorise Bullion III M.D   On: 12/27/2018 21:10   Dg Abd Portable 1v  Result Date: 12/29/2018 CLINICAL DATA:  Small bowel obstruction. EXAM: PORTABLE ABDOMEN - 1 VIEW COMPARISON:  Abdominal x-ray dated Dec 27, 2018. FINDINGS: Interval removal of the enteric tube. Persistent small bowel dilatation in the central abdomen. No acute osseous abnormality. IMPRESSION: Unchanged small bowel obstruction. Electronically Signed   By: Titus Dubin M.D.   On: 12/29/2018 09:27        Scheduled Meds: . enoxaparin (LOVENOX) injection  40 mg Subcutaneous Q24H  . lip balm  1 application Topical BID   Continuous Infusions: . methocarbamol (ROBAXIN) IV Stopped (12/28/18 1510)  . ondansetron (ZOFRAN) IV       LOS: 3 days     Cordelia Poche, MD [facility] 12/29/2018, 10:56 AM  If 7PM-7AM, please contact night-coverage [email or web address]

## 2018-12-29 NOTE — Progress Notes (Signed)
Patient called out and asked for RN. I reported to the room where patient told me her NG tube came out as she was ambulating to the bathroom. Patient has no N/V currently. MD paged. Will continue to monitor.

## 2018-12-29 NOTE — Progress Notes (Signed)
[facility] Progress Note      Assessment/Plan Principal Problem:   Abdominal pain Active Problems:   Essential hypertension   Hypokalemia   AKI (acute kidney injury) ([facility])  SBO - Prior surgeries includes a tubal ligation, and robotic assisted laparoscopic vaginal hysterectomy with fibroid removal - AXR's have showen persistent SBO, AXR this am pending  - NGT came out this am - pt having flatus and less abdominal distention, no N or V  FEN: CLD VTE: SCD's, lovenox ID: none Foley: none Follow up: TBD  DISPO: Will try a trial of CLD, AXR pending, hopefully pt will not require surgery. If she begins vomiting again she will need another NGT. We will continue to follow     LOS: 3 days    Subjective: CC: no complaints  No abdominal pain, nausea or vomiting. Pt is having flatus but no BM. She feels less bloated. I discussed possible adhesive etiology for SBO.    Objective: Vital signs in last 24 hours: Temp:  [99 F (37.2 C)-99.6 F (37.6 C)] 99.1 F (37.3 C) (05/04 0501) Pulse Rate:  [106-120] 106 (05/04 0501) Resp:  [18] 18 (05/04 0501) BP: (120-128)/(85-89) 128/89 (05/04 0501) SpO2:  [89 %-95 %] 95 % (05/04 0501) Weight:  [100 kg] 100 kg (05/04 0600) Last BM Date: 12/28/18  Intake/Output from previous day: 05/03 0701 - 05/04 0700 In: 2000 [P.O.:600; I.V.:1400] Out: 2000 [Emesis/NG output:2000] Intake/Output this shift: No intake/output data recorded.  PE: Gen:  Alert, NAD, pleasant, cooperative Pulm:  Rate and effort normal Abd: Soft, obese, NT/ND, +BS Skin: no rashes noted, warm and dry   Anti-infectives: Anti-infectives (From admission, onward)   None      Lab Results:  Recent Labs    12/29/18 0349  WBC 7.3  HGB 12.7  HCT 40.5  PLT 257   BMET Recent Labs    12/28/18 0858 12/29/18 0349  NA 141 138  K 2.9* 3.0*  CL 95* 97*  CO2 32 31  GLUCOSE 123* 147*  BUN 29* 26*  CREATININE 1.49* 1.26*  CALCIUM 9.4 9.0    PT/INR No results for input(s): LABPROT, INR in the last 72 hours. CMP     Component Value Date/Time   NA 138 12/29/2018 0349   K 3.0 (L) 12/29/2018 0349   CL 97 (L) 12/29/2018 0349   CO2 31 12/29/2018 0349   GLUCOSE 147 (H) 12/29/2018 0349   BUN 26 (H) 12/29/2018 0349   CREATININE 1.26 (H) 12/29/2018 0349   CALCIUM 9.0 12/29/2018 0349   PROT 6.8 12/26/2018 0334   ALBUMIN 3.8 12/26/2018 0334   AST 22 12/26/2018 0334   ALT 15 12/26/2018 0334   ALKPHOS 59 12/26/2018 0334   BILITOT 1.1 12/26/2018 0334   GFRNONAA 47 (L) 12/29/2018 0349   GFRAA 54 (L) 12/29/2018 0349   Lipase     Component Value Date/Time   LIPASE 26 12/25/2018 0751    Studies/Results: Dg Abd 2 Views  Result Date: 12/27/2018 CLINICAL DATA:  Small bowel obstruction. EXAM: ABDOMEN - 2 VIEW COMPARISON:  Dec 26, 2018 FINDINGS: Continued small bowel obstruction with dilated loops of small bowel, some of which demonstrate air-fluid levels. The distal tip of the NG tube is in the fundus with the side port just below the GE junction. IMPRESSION: 1. Continued small bowel obstruction. 2. The side port of the NG tube is just below the GE junction with the distal tip in the fundus. Electronically Signed   By: Shanon Brow  Mee Hives M.D   On: 12/27/2018 21:10      Kalman Drape , [facility] 12/29/2018, 8:18 AM  Pager: [phone] Mon-Wed, Friday 7:00am-4:30pm Thurs 7am-11:30am  Consults: [phone]

## 2018-12-30 ENCOUNTER — Inpatient Hospital Stay (HOSPITAL_COMMUNITY): Payer: BLUE CROSS/BLUE SHIELD

## 2018-12-30 ENCOUNTER — Encounter (HOSPITAL_COMMUNITY): Payer: Self-pay | Admitting: *Deleted

## 2018-12-30 ENCOUNTER — Encounter (HOSPITAL_COMMUNITY)
Admission: EM | Disposition: A | Discharge status: Home / Self Care | Payer: Self-pay | Source: Home / Self Care | Attending: Internal Medicine

## 2018-12-30 ENCOUNTER — Inpatient Hospital Stay (HOSPITAL_COMMUNITY): Payer: BLUE CROSS/BLUE SHIELD | Admitting: Certified Registered"

## 2018-12-30 HISTORY — PX: LAPAROTOMY: SHX154

## 2018-12-30 LAB — BASIC METABOLIC PANEL
Anion gap: 9 (ref 5–15)
BUN: 18 mg/dL (ref 6–20)
CO2: 27 mmol/L (ref 22–32)
Calcium: 8.4 mg/dL — ABNORMAL LOW (ref 8.9–10.3)
Chloride: 102 mmol/L (ref 98–111)
Creatinine, Ser: 1.25 mg/dL — ABNORMAL HIGH (ref 0.44–1.00)
GFR calc Af Amer: 55 mL/min — ABNORMAL LOW (ref >60)
GFR calc non Af Amer: 47 mL/min — ABNORMAL LOW (ref >60)
Glucose, Bld: 119 mg/dL — ABNORMAL HIGH (ref 70–99)
Potassium: 4.4 mmol/L (ref 3.5–5.1)
Sodium: 138 mmol/L (ref 135–145)

## 2018-12-30 LAB — MRSA PCR SCREENING: MRSA by PCR: NEGATIVE

## 2018-12-30 LAB — GLUCOSE, CAPILLARY: Glucose-Capillary: 121 mg/dL — ABNORMAL HIGH (ref 70–99)

## 2018-12-30 SURGERY — LAPAROTOMY, EXPLORATORY
Anesthesia: General | Site: Abdomen

## 2018-12-30 MED ORDER — DIPHENHYDRAMINE HCL 12.5 MG/5ML PO ELIX
12.5000 mg | ORAL_SOLUTION | Freq: Four times a day (QID) | ORAL | Status: DC | PRN
  Filled 2018-12-30: qty 5

## 2018-12-30 MED ORDER — ONDANSETRON HCL 4 MG/2ML IJ SOLN
INTRAMUSCULAR | Status: DC | PRN
  Administered 2018-12-30: 4 mg via INTRAVENOUS

## 2018-12-30 MED ORDER — PROPOFOL 10 MG/ML IV BOLUS
INTRAVENOUS | Status: DC | PRN
  Administered 2018-12-30: 150 mg via INTRAVENOUS

## 2018-12-30 MED ORDER — FENTANYL CITRATE (PF) 100 MCG/2ML IJ SOLN
INTRAMUSCULAR | Status: AC
  Filled 2018-12-30: qty 2

## 2018-12-30 MED ORDER — SUCCINYLCHOLINE CHLORIDE 200 MG/10ML IV SOSY
PREFILLED_SYRINGE | INTRAVENOUS | Status: DC | PRN
  Administered 2018-12-30: 140 mg via INTRAVENOUS

## 2018-12-30 MED ORDER — ROCURONIUM BROMIDE 10 MG/ML (PF) SYRINGE
PREFILLED_SYRINGE | INTRAVENOUS | Status: DC | PRN
  Administered 2018-12-30: 10 mg via INTRAVENOUS
  Administered 2018-12-30: 40 mg via INTRAVENOUS

## 2018-12-30 MED ORDER — FENTANYL CITRATE (PF) 250 MCG/5ML IJ SOLN
INTRAMUSCULAR | Status: DC | PRN
  Administered 2018-12-30: 50 ug via INTRAVENOUS
  Administered 2018-12-30: 150 ug via INTRAVENOUS
  Administered 2018-12-30 (×3): 50 ug via INTRAVENOUS

## 2018-12-30 MED ORDER — 0.9 % SODIUM CHLORIDE (POUR BTL) OPTIME
TOPICAL | Status: DC | PRN
  Administered 2018-12-30: 16:00:00 2000 mL

## 2018-12-30 MED ORDER — DEXAMETHASONE SODIUM PHOSPHATE 10 MG/ML IJ SOLN
INTRAMUSCULAR | Status: AC
  Filled 2018-12-30: qty 1

## 2018-12-30 MED ORDER — SUGAMMADEX SODIUM 200 MG/2ML IV SOLN
INTRAVENOUS | Status: DC | PRN
  Administered 2018-12-30: 200 mg via INTRAVENOUS

## 2018-12-30 MED ORDER — DIPHENHYDRAMINE HCL 50 MG/ML IJ SOLN: 12.5000 mg | Freq: Four times a day (QID) | INTRAMUSCULAR | Status: DC | PRN

## 2018-12-30 MED ORDER — MIDAZOLAM HCL 2 MG/2ML IJ SOLN
INTRAMUSCULAR | Status: AC
  Filled 2018-12-30: qty 2

## 2018-12-30 MED ORDER — CIPROFLOXACIN IN D5W 400 MG/200ML IV SOLN
400.0000 mg | Freq: Once | INTRAVENOUS | Status: AC
  Administered 2018-12-30: 16:00:00 400 mg via INTRAVENOUS
  Filled 2018-12-30: qty 200

## 2018-12-30 MED ORDER — BUPIVACAINE HCL (PF) 0.5 % IJ SOLN
INTRAMUSCULAR | Status: AC
  Filled 2018-12-30: qty 30

## 2018-12-30 MED ORDER — MIDAZOLAM HCL 2 MG/2ML IJ SOLN: 0.5000 mg | Freq: Once | INTRAMUSCULAR | Status: DC | PRN

## 2018-12-30 MED ORDER — MIDAZOLAM HCL 2 MG/2ML IJ SOLN
INTRAMUSCULAR | Status: DC | PRN
  Administered 2018-12-30: 2 mg via INTRAVENOUS

## 2018-12-30 MED ORDER — LACTATED RINGERS IV SOLN
INTRAVENOUS | Status: DC
  Administered 2018-12-30: 12:00:00 via INTRAVENOUS

## 2018-12-30 MED ORDER — ROCURONIUM BROMIDE 10 MG/ML (PF) SYRINGE
PREFILLED_SYRINGE | INTRAVENOUS | Status: AC
  Filled 2018-12-30: qty 10

## 2018-12-30 MED ORDER — SUGAMMADEX SODIUM 200 MG/2ML IV SOLN
INTRAVENOUS | Status: AC
  Filled 2018-12-30: qty 2

## 2018-12-30 MED ORDER — SODIUM CHLORIDE 0.9% FLUSH: 9.0000 mL | INTRAVENOUS | Status: DC | PRN

## 2018-12-30 MED ORDER — LIDOCAINE 2% (20 MG/ML) 5 ML SYRINGE
INTRAMUSCULAR | Status: AC
  Filled 2018-12-30: qty 5

## 2018-12-30 MED ORDER — MORPHINE SULFATE 2 MG/ML IV SOLN
INTRAVENOUS | Status: DC
  Administered 2018-12-30 – 2018-12-31 (×2): via INTRAVENOUS
  Administered 2018-12-31: 6 mg via INTRAVENOUS
  Administered 2019-01-01: 1.5 mg via INTRAVENOUS
  Administered 2019-01-01: 2 mg via INTRAVENOUS
  Administered 2019-01-01: 0 mg via INTRAVENOUS
  Administered 2019-01-01: 1 mg via INTRAVENOUS
  Administered 2019-01-01: 2 mg via INTRAVENOUS
  Administered 2019-01-01: 1.5 mg via INTRAVENOUS
  Administered 2019-01-02: 0 mg via INTRAVENOUS
  Administered 2019-01-02: 1.5 mg via INTRAVENOUS
  Administered 2019-01-02: 3 mg via INTRAVENOUS
  Filled 2018-12-30 (×2): qty 30

## 2018-12-30 MED ORDER — PROPOFOL 10 MG/ML IV BOLUS
INTRAVENOUS | Status: AC
  Filled 2018-12-30: qty 20

## 2018-12-30 MED ORDER — LIP MEDEX EX OINT
TOPICAL_OINTMENT | CUTANEOUS | Status: AC
  Administered 2018-12-30: 19:00:00
  Filled 2018-12-30: qty 7

## 2018-12-30 MED ORDER — DEXAMETHASONE SODIUM PHOSPHATE 10 MG/ML IJ SOLN
INTRAMUSCULAR | Status: DC | PRN
  Administered 2018-12-30: 8 mg via INTRAVENOUS

## 2018-12-30 MED ORDER — LACTATED RINGERS IV SOLN
INTRAVENOUS | Status: DC | PRN
  Administered 2018-12-30: 15:00:00 via INTRAVENOUS

## 2018-12-30 MED ORDER — NALOXONE HCL 0.4 MG/ML IJ SOLN: 0.4000 mg | INTRAMUSCULAR | Status: DC | PRN

## 2018-12-30 MED ORDER — MEPERIDINE HCL 50 MG/ML IJ SOLN: 6.2500 mg | INTRAMUSCULAR | Status: DC | PRN

## 2018-12-30 MED ORDER — HYDROMORPHONE HCL 1 MG/ML IJ SOLN
INTRAMUSCULAR | Status: AC
  Filled 2018-12-30: qty 2

## 2018-12-30 MED ORDER — ONDANSETRON HCL 4 MG/2ML IJ SOLN
INTRAMUSCULAR | Status: AC
  Filled 2018-12-30: qty 2

## 2018-12-30 MED ORDER — LIDOCAINE 2% (20 MG/ML) 5 ML SYRINGE
INTRAMUSCULAR | Status: DC | PRN
  Administered 2018-12-30: 40 mg via INTRAVENOUS

## 2018-12-30 MED ORDER — ALBUMIN HUMAN 5 % IV SOLN
INTRAVENOUS | Status: AC
  Filled 2018-12-30: qty 250

## 2018-12-30 MED ORDER — PROMETHAZINE HCL 25 MG/ML IJ SOLN: 6.2500 mg | INTRAMUSCULAR | Status: DC | PRN

## 2018-12-30 MED ORDER — PHENYLEPHRINE 40 MCG/ML (10ML) SYRINGE FOR IV PUSH (FOR BLOOD PRESSURE SUPPORT)
PREFILLED_SYRINGE | INTRAVENOUS | Status: DC | PRN
  Administered 2018-12-30 (×3): 80 ug via INTRAVENOUS

## 2018-12-30 MED ORDER — ONDANSETRON HCL 4 MG/2ML IJ SOLN: 4.0000 mg | Freq: Four times a day (QID) | INTRAMUSCULAR | Status: DC | PRN

## 2018-12-30 MED ORDER — HYDROMORPHONE HCL 1 MG/ML IJ SOLN
0.2500 mg | INTRAMUSCULAR | Status: DC | PRN
  Administered 2018-12-30 (×2): 0.5 mg via INTRAVENOUS

## 2018-12-30 MED ORDER — ALBUMIN HUMAN 5 % IV SOLN
INTRAVENOUS | Status: DC | PRN
  Administered 2018-12-30: 16:00:00 via INTRAVENOUS

## 2018-12-30 MED ORDER — FENTANYL CITRATE (PF) 250 MCG/5ML IJ SOLN
INTRAMUSCULAR | Status: AC
  Filled 2018-12-30: qty 5

## 2018-12-30 SURGICAL SUPPLY — 50 items
APL PRP STRL LF DISP 70% ISPRP (MISCELLANEOUS) ×2
APL SWBSTK 6 STRL LF DISP (MISCELLANEOUS)
APPLICATOR COTTON TIP 6 STRL (MISCELLANEOUS) ×2 IMPLANT
APPLICATOR COTTON TIP 6IN STRL (MISCELLANEOUS)
BLADE EXTENDED COATED 6.5IN (ELECTRODE) IMPLANT
BLADE HEX COATED 2.75 (ELECTRODE) ×1 IMPLANT
BLADE SURG SZ10 CARB STEEL (BLADE) IMPLANT
CHLORAPREP W/TINT 26 (MISCELLANEOUS) ×4 IMPLANT
CLIP VESOCCLUDE LG 6/CT (CLIP) IMPLANT
COVER MAYO STAND STRL (DRAPES) ×4 IMPLANT
COVER SURGICAL LIGHT HANDLE (MISCELLANEOUS) ×4 IMPLANT
COVER WAND RF STERILE (DRAPES) IMPLANT
DRAPE LAPAROSCOPIC ABDOMINAL (DRAPES) ×4 IMPLANT
DRAPE SHEET LG 3/4 BI-LAMINATE (DRAPES) ×3 IMPLANT
DRAPE WARM FLUID 44X44 (DRAPE) ×7 IMPLANT
DRSG OPSITE POSTOP 4X10 (GAUZE/BANDAGES/DRESSINGS) ×3 IMPLANT
DRSG OPSITE POSTOP 4X8 (GAUZE/BANDAGES/DRESSINGS) ×3 IMPLANT
DRSG PAD ABDOMINAL 8X10 ST (GAUZE/BANDAGES/DRESSINGS) IMPLANT
ELECT REM PT RETURN 15FT ADLT (MISCELLANEOUS) ×4 IMPLANT
GAUZE SPONGE 4X4 12PLY STRL (GAUZE/BANDAGES/DRESSINGS) ×4 IMPLANT
GLOVE BIOGEL PI IND STRL 7.0 (GLOVE) ×2 IMPLANT
GLOVE BIOGEL PI INDICATOR 7.0 (GLOVE) ×2
GLOVE SURG SIGNA 7.5 PF LTX (GLOVE) ×12 IMPLANT
GOWN STRL REUS W/TWL LRG LVL3 (GOWN DISPOSABLE) ×4 IMPLANT
GOWN STRL REUS W/TWL XL LVL3 (GOWN DISPOSABLE) ×8 IMPLANT
HANDLE SUCTION POOLE (INSTRUMENTS) ×2 IMPLANT
KIT BASIN OR (CUSTOM PROCEDURE TRAY) ×4 IMPLANT
KIT TURNOVER KIT A (KITS) ×3 IMPLANT
LEGGING LITHOTOMY PAIR STRL (DRAPES) IMPLANT
NS IRRIG 1000ML POUR BTL (IV SOLUTION) ×8 IMPLANT
PACK GENERAL/GYN (CUSTOM PROCEDURE TRAY) ×4 IMPLANT
SHEARS HARMONIC ACE PLUS 36CM (ENDOMECHANICALS) IMPLANT
STAPLER VISISTAT 35W (STAPLE) ×1 IMPLANT
SUCTION POOLE HANDLE (INSTRUMENTS) ×4
SUT NOV 1 T60/GS (SUTURE) IMPLANT
SUT NOVA NAB DX-16 0-1 5-0 T12 (SUTURE) IMPLANT
SUT NOVA T20/GS 25 (SUTURE) IMPLANT
SUT PDS AB 1 TP1 96 (SUTURE) ×8 IMPLANT
SUT SILK 2 0 (SUTURE) ×4
SUT SILK 2 0 SH CR/8 (SUTURE) ×4 IMPLANT
SUT SILK 2 0SH CR/8 30 (SUTURE) IMPLANT
SUT SILK 2-0 18XBRD TIE 12 (SUTURE) ×2 IMPLANT
SUT SILK 2-0 30XBRD TIE 12 (SUTURE) IMPLANT
SUT SILK 3 0 (SUTURE) ×4
SUT SILK 3 0 SH CR/8 (SUTURE) ×4 IMPLANT
SUT SILK 3-0 18XBRD TIE 12 (SUTURE) ×3 IMPLANT
TOWEL OR 17X26 10 PK STRL BLUE (TOWEL DISPOSABLE) ×8 IMPLANT
TRAY FOLEY MTR SLVR 16FR STAT (SET/KITS/TRAYS/PACK) ×4 IMPLANT
WATER STERILE IRR 1000ML POUR (IV SOLUTION) IMPLANT
YANKAUER SUCT BULB TIP NO VENT (SUCTIONS) ×4 IMPLANT

## 2018-12-30 NOTE — Anesthesia Postprocedure Evaluation (Addendum)
Anesthesia Post Note  Patient: Danielle Griffin  Procedure(s) Performed: EXPLORATORY LAPAROTOMY, LYSIS OF ADHESIONS (N/A Abdomen)     Patient location during evaluation: PACU Anesthesia Type: General Level of consciousness: patient cooperative, oriented and sedated Pain management: pain level controlled Vital Signs Assessment: post-procedure vital signs reviewed and stable Respiratory status: spontaneous breathing, nonlabored ventilation, respiratory function stable and patient connected to nasal cannula oxygen Cardiovascular status: blood pressure returned to baseline and stable Postop Assessment: no apparent nausea or vomiting Anesthetic complications: no    Last Vitals:  Vitals:   12/30/18 1730 12/30/18 1745  BP: 126/87 (!) 131/95  Pulse: 96 99  Resp: 18 20  Temp:  36.4 C  SpO2: 92% 94%    Last Pain:  Vitals:   12/30/18 1730  TempSrc:   PainSc: Asleep                 Mycah Mcdougall,E. Talbert Trembath

## 2018-12-30 NOTE — Op Note (Signed)
EXPLORATORY LAPAROTOMY, LYSIS OF ADHESIONS  Procedure Note  Danielle Griffin 12/30/2018   Pre-op Diagnosis: SMALL BOWEL OBSTRUCTION     Post-op Diagnosis: same  Procedure(s): EXPLORATORY LAPAROTOMY, LYSIS OF ADHESIONS  Surgeon(s): Coralie Keens, MD  Jens Som MD  Anesthesia: General  Staff:  Circulator: Noralyn Pick, RN; Wallis Mart, RN Relief Scrub: Gloris Manchester Scrub Person: Dewaine Conger  Estimated Blood Loss: Minimal               Indications: This is a 58 year old female who presented with a small bowel obstruction.  She failed conservative management with nasogastric suctioning.  The decision was made to proceed to the operating room.  Findings: The patient was found to have 1 adhesive band creating a mid small bowel obstruction.  The bowel did not appear compromised.  Procedure: The patient was brought to the operating room and identifies correct patient.  She is placed upon the operating table general anesthesia was induced.  Her abdomen was then prepped and draped in usual sterile fashion.  I created a midline incision with a scalpel.  This was taken down through the fascia and peritoneum with electrocautery.  The patient had the omentum adherent to the abdominal wall.  This was taken down with the cautery.  As I carried the dissection toward the pelvis, the patient was found to have an enlarged uterus stuck up to the abdominal wall at the midline.  This was left in place.  I then eviscerated the small bowel.  It was dilated proximally and clipped distally.  A single thick it adhesive band was identified and transected relieving the obstruction.  We then eviscerated all the small bowel.  I was able to easily milk contents through the area that had obstructed.  There was no evidence of ischemia.  No other evidence of obstruction was identified.  He returned the bowel back into the abdominal cavity.  Hemostasis appeared to be achieved.  I then closed the  patient's midline fascia with a running #1 looped PDS suture.  The skin was irrigated and closed skin staples.  The patient tolerated procedure well.  All the counts were correct at the end of the procedure.  The patient was then extubated in the operating room and taken in a stable addition to the recovery room.          Coralie Keens   Date: 12/30/2018  Time: 4:19 PM

## 2018-12-30 NOTE — Progress Notes (Signed)
Patient ID: Danielle Griffin, female   DOB: 27-Nov-1960, 58 y.o.   MRN: [id number]   Patient's SBO is not improving.  Small bowel is more dilated on her xray this morning. She needs and is now agreeable to surgery. Will proceed to the OR today for an exploratory laparotomy. I discussed the risks which include but are not limited to bleeding, infection, the need for bowel resection, injury to surrounding structures, cardiopulmonary issues, DVT, post op recovery, etc. She agrees to proceed.

## 2018-12-30 NOTE — Anesthesia Preprocedure Evaluation (Addendum)
Anesthesia Evaluation  Patient identified by MRN, date of birth, ID band Patient awake    Reviewed: Allergy & Precautions, NPO status , Patient's Chart, lab work & pertinent test results  History of Anesthesia Complications Negative for: history of anesthetic complications  Airway Mallampati: II  TM Distance: >3 FB Neck ROM: Full    Dental  (+) Dental Advisory Given   Pulmonary neg pulmonary ROS,    breath sounds clear to auscultation       Cardiovascular hypertension, Pt. on medications (-) angina Rhythm:Regular Rate:Normal     Neuro/Psych  Headaches,    GI/Hepatic Neg liver ROS, GERD  Medicated and Controlled,N/v with SBO   Endo/Other  Morbid obesity  Renal/GU Renal InsufficiencyRenal disease (creat 1.26)     Musculoskeletal   Abdominal (+) + obese,   Peds  Hematology negative hematology ROS (+)   Anesthesia Other Findings   Reproductive/Obstetrics                            Anesthesia Physical Anesthesia Plan  ASA: II  Anesthesia Plan: General   Post-op Pain Management:    Induction: Intravenous  PONV Risk Score and Plan: 4 or greater and Scopolamine patch - Pre-op, Dexamethasone and Ondansetron  Airway Management Planned: Oral ETT  Additional Equipment:   Intra-op Plan:   Post-operative Plan:   Informed Consent: I have reviewed the patients History and Physical, chart, labs and discussed the procedure including the risks, benefits and alternatives for the proposed anesthesia with the patient or authorized representative who has indicated his/her understanding and acceptance.     Dental advisory given  Plan Discussed with: CRNA and Surgeon  Anesthesia Plan Comments:         Anesthesia Quick Evaluation

## 2018-12-30 NOTE — Progress Notes (Signed)
[facility] Progress Note      Assessment/Plan Principal Problem:   Abdominal pain Active Problems:   Essential hypertension   Hypokalemia   AKI (acute kidney injury) ([facility])  SBO - Prior surgeries includes a tubal ligation, and robotic assisted laparoscopic vaginal hysterectomy with fibroid removal - AXR's have showen persistent SBO, AXR this am showed worsening dilatation   - bilious emesis last evening and no flatus   FEN: NPO VTE: SCD's, lovenox ID: none Foley: none Follow up: TBD  DISPO: OR today as SBO is not improving     LOS: 4 days    Subjective: CC: nausea, vomiting and diarrhea  Pt states one episode of nausea, vomiting and diarrhea last evening. No flatus or BM since. No nausea or vomiting since. She states some intermittent crampy abdominal pain but no sharp pains. I spoke with her son, Tavarus, on the phone.   Objective: Vital signs in last 24 hours: Temp:  [97.9 F (36.6 C)-98.9 F (37.2 C)] 97.9 F (36.6 C) (05/05 0527) Pulse Rate:  [92-96] 92 (05/05 0527) Resp:  [16-18] 18 (05/05 0527) BP: (102-138)/(78-94) 123/78 (05/05 0527) SpO2:  [93 %-95 %] 93 % (05/05 0527) Weight:  [96.7 kg] 96.7 kg (05/05 0527) Last BM Date: 12/29/18  Intake/Output from previous day: 05/04 0701 - 05/05 0700 In: 1202.5 [P.O.:360; I.V.:842.5] Out: -  Intake/Output this shift: No intake/output data recorded.  PE: Gen:  Alert, NAD, pleasant, cooperative Pulm:  Rate and effort normal Abd: Soft, obese, ND, +BS, very mild TTP without guarding, no peritonitis  Skin: no rashes noted, warm and dry   Anti-infectives: Anti-infectives (From admission, onward)   Start     Dose/Rate Route Frequency Ordered Stop   12/30/18 1200  ciprofloxacin (CIPRO) IVPB 400 mg    Note to Pharmacy:  preop For surgery today   400 mg 200 mL/hr over 60 Minutes Intravenous  Once 12/30/18 0826        Lab Results:  Recent Labs    12/29/18 0349  WBC 7.3  HGB 12.7   HCT 40.5  PLT 257   BMET Recent Labs    12/28/18 0858 12/29/18 0349 12/29/18 1639  NA 141 138  --   K 2.9* 3.0* 3.0*  CL 95* 97*  --   CO2 32 31  --   GLUCOSE 123* 147*  --   BUN 29* 26*  --   CREATININE 1.49* 1.26*  --   CALCIUM 9.4 9.0  --    PT/INR No results for input(s): LABPROT, INR in the last 72 hours. CMP     Component Value Date/Time   NA 138 12/29/2018 0349   K 3.0 (L) 12/29/2018 1639   CL 97 (L) 12/29/2018 0349   CO2 31 12/29/2018 0349   GLUCOSE 147 (H) 12/29/2018 0349   BUN 26 (H) 12/29/2018 0349   CREATININE 1.26 (H) 12/29/2018 0349   CALCIUM 9.0 12/29/2018 0349   PROT 6.8 12/26/2018 0334   ALBUMIN 3.8 12/26/2018 0334   AST 22 12/26/2018 0334   ALT 15 12/26/2018 0334   ALKPHOS 59 12/26/2018 0334   BILITOT 1.1 12/26/2018 0334   GFRNONAA 47 (L) 12/29/2018 0349   GFRAA 54 (L) 12/29/2018 0349   Lipase     Component Value Date/Time   LIPASE 26 12/25/2018 0751    Studies/Results: Dg Abd Portable 1v  Result Date: 12/30/2018 CLINICAL DATA:  Follow-up small bowel obstruction EXAM: PORTABLE ABDOMEN - 1 VIEW COMPARISON:  Yesterday FINDINGS: Continued prominent small  bowel distension with loops measuring up to 5.7 cm diameter. The degree of distention has increased. No concerning mass effect or gas collection. IMPRESSION: Small bowel obstruction with progressive distention. Electronically Signed   By: Monte Fantasia M.D.   On: 12/30/2018 04:47   Dg Abd Portable 1v  Result Date: 12/29/2018 CLINICAL DATA:  Small bowel obstruction. EXAM: PORTABLE ABDOMEN - 1 VIEW COMPARISON:  Abdominal x-ray dated Dec 27, 2018. FINDINGS: Interval removal of the enteric tube. Persistent small bowel dilatation in the central abdomen. No acute osseous abnormality. IMPRESSION: Unchanged small bowel obstruction. Electronically Signed   By: Titus Dubin M.D.   On: 12/29/2018 09:27      Kalman Drape , [facility] 12/30/2018, 8:41 AM  Pager:  [phone] Mon-Wed, Friday 7:00am-4:30pm Thurs 7am-11:30am  Consults: [phone]

## 2018-12-30 NOTE — Transfer of Care (Signed)
Immediate Anesthesia Transfer of Care Note  Patient: Danielle Griffin  Procedure(s) Performed: EXPLORATORY LAPAROTOMY, LYSIS OF ADHESIONS (N/A Abdomen)  Patient Location: PACU  Anesthesia Type:General  Level of Consciousness: awake, alert  and oriented  Airway & Oxygen Therapy: Patient Spontanous Breathing and Patient connected to face mask oxygen  Post-op Assessment: Report given to RN and Post -op Vital signs reviewed and stable  Post vital signs: Reviewed and stable  Last Vitals:  Vitals Value Taken Time  BP 140/86 12/30/2018  5:08 PM  Temp    Pulse 93 12/30/2018  5:09 PM  Resp 16 12/30/2018  5:12 PM  SpO2 93 % 12/30/2018  5:09 PM  Vitals shown include unvalidated device data.  Last Pain:  Vitals:   12/30/18 1217  TempSrc: Oral  PainSc:       Patients Stated Pain Goal: 3 (07/68/08 8110)  Complications: No apparent anesthesia complications

## 2018-12-30 NOTE — Progress Notes (Signed)
PROGRESS NOTE    Danielle Griffin  JHE:[id number] DOB: 14-Jan-1961 DOA: 12/25/2018 PCP: [facility]   Brief Narrative: Danielle Griffin is a 58 y.o. female with medical history significant of with past medical history of essential hypertension. She presented secondary to abdominal pain and found to have an SBO. NG tube placed.   Assessment & Plan:   Principal Problem:   Abdominal pain Active Problems:   Essential hypertension   Hypokalemia   AKI (acute kidney injury) ([place])   Small bowel obstruction Seen on CT. Possible transition point in area of left adnexa. NG tube placed with 1.5 L out since admission (likely including oral contrast from CT). NG output of 2000 mL over last 24 hours. Failed clamp trial. Minimal BM with suppository. NG tube came out yesterday accidentally. Does not appear to be progressing well, unfortunately -General surgery recommendations: surgery today  Acute kidney injury Unknown baseline. Creatinine of 2.4 on admission. Trending down with IV fluids. -D5 1/2 NS IV fluids w/ potassium -BMP today  Essential hypertension On hydrochlorothiazide and olmesartan as an outpatient. Held secondary to AKI and dehydration -Continue Hydralazine prn  Hypokalemia -BMP today to recheck -Discontinue potassium containing fluids if stable   DVT prophylaxis: SCDs Code Status:   Code Status: Full Code Family Communication: None Disposition Plan: Discharge pending improvement of SBO and general surgery recommendations   Consultants:   General surgery  Procedures:   NG tube (4/30>>5/4)  Antimicrobials:  None    Subjective: No bowel movement. No abdominal pain.  Objective: Vitals:   12/29/18 1450 12/30/18 0300 12/30/18 0527 12/30/18 1217  BP: (!) 138/94 102/78 123/78 122/78  Pulse: 96 96 92 87  Resp: 17 16 18  (!) 21  Temp: 98.9 F (37.2 C) 98.1 F (36.7 C) 97.9 F (36.6 C) 98.5 F (36.9 C)  TempSrc: Oral Oral Oral Oral  SpO2:  95% 95% 93% 93%  Weight:   96.7 kg   Height:        Intake/Output Summary (Last 24 hours) at 12/30/2018 1236 Last data filed at 12/30/2018 0600 Gross per 24 hour  Intake 952.49 ml  Output -  Net 952.49 ml   Filed Weights   12/27/18 0521 12/29/18 0600 12/30/18 0527  Weight: 98.7 kg 100 kg 96.7 kg    Examination:  General exam: Appears calm and comfortable Respiratory system: Clear to auscultation. Respiratory effort normal. Cardiovascular system: S1 & S2 heard, RRR. No murmurs, rubs, gallops or clicks. Gastrointestinal system: Abdomen is more distended today, soft and nontender. No organomegaly or masses felt. Improved bowel sounds today. Central nervous system: Alert and oriented. No focal neurological deficits. Extremities: No edema. No calf tenderness Skin: No cyanosis. No rashes Psychiatry: Judgement and insight appear normal. Mood & affect appropriate.     Data Reviewed: I have personally reviewed following labs and imaging studies  CBC: Recent Labs  Lab 12/25/18 0751 12/26/18 0334 12/29/18 0349  WBC 5.6 4.8 7.3  NEUTROABS 3.8  --   --   HGB 14.6 13.9 12.7  HCT 46.5* 43.7 40.5  MCV 95.7 97.3 95.7  PLT 278 230 185   Basic Metabolic Panel: Recent Labs  Lab 12/25/18 0751 12/26/18 0334 12/27/18 1300 12/28/18 0858 12/29/18 0349 12/29/18 1639  NA 139 140 139 141 138  --   K 3.6 3.5 3.5 2.9* 3.0* 3.0*  CL 100 103 96* 95* 97*  --   CO2 26 25 29  32 31  --   GLUCOSE 146* 138*  163* 123* 147*  --   BUN 37* 31* 27* 29* 26*  --   CREATININE 2.40* 1.40* 1.50* 1.49* 1.26*  --   CALCIUM 9.2 8.6* 9.5 9.4 9.0  --    GFR: Estimated Creatinine Clearance: 55.3 mL/min (A) (by C-G formula based on SCr of 1.26 mg/dL (H)). Liver Function Tests: Recent Labs  Lab 12/25/18 0751 12/26/18 0334  AST 25 22  ALT 17 15  ALKPHOS 66 59  BILITOT 1.0 1.1  PROT 8.2* 6.8  ALBUMIN 4.6 3.8   Recent Labs  Lab 12/25/18 0751  LIPASE 26   No results for input(s): AMMONIA in the  last 168 hours. Coagulation Profile: No results for input(s): INR, PROTIME in the last 168 hours. Cardiac Enzymes: No results for input(s): CKTOTAL, CKMB, CKMBINDEX, TROPONINI in the last 168 hours. BNP (last 3 results) No results for input(s): PROBNP in the last 8760 hours. HbA1C: No results for input(s): HGBA1C in the last 72 hours. CBG: Recent Labs  Lab 12/26/18 0746 12/27/18 0854 12/28/18 0731 12/29/18 0724 12/30/18 0736  GLUCAP 129* 160* 142* 92 121*   Lipid Profile: No results for input(s): CHOL, HDL, LDLCALC, TRIG, CHOLHDL, LDLDIRECT in the last 72 hours. Thyroid Function Tests: No results for input(s): TSH, T4TOTAL, FREET4, T3FREE, THYROIDAB in the last 72 hours. Anemia Panel: No results for input(s): VITAMINB12, FOLATE, FERRITIN, TIBC, IRON, RETICCTPCT in the last 72 hours. Sepsis Labs: No results for input(s): PROCALCITON, LATICACIDVEN in the last 168 hours.  Recent Results (from the past 240 hour(s))  MRSA PCR Screening     Status: None   Collection Time: 12/30/18  8:56 AM  Result Value Ref Range Status   MRSA by PCR NEGATIVE NEGATIVE Final    Comment:        The GeneXpert MRSA Assay (FDA approved for NASAL specimens only), is one component of a comprehensive MRSA colonization surveillance program. It is not intended to diagnose MRSA infection nor to guide or monitor treatment for MRSA infections. Performed at [facility], [place] [address], [place], [place] [address]          Radiology Studies: Dg Abd Portable 1v  Result Date: 12/30/2018 CLINICAL DATA:  Follow-up small bowel obstruction EXAM: PORTABLE ABDOMEN - 1 VIEW COMPARISON:  Yesterday FINDINGS: Continued prominent small bowel distension with loops measuring up to 5.7 cm diameter. The degree of distention has increased. No concerning mass effect or gas collection. IMPRESSION: Small bowel obstruction with progressive distention. Electronically Signed   By: Monte Fantasia M.D.    On: 12/30/2018 04:47   Dg Abd Portable 1v  Result Date: 12/29/2018 CLINICAL DATA:  Small bowel obstruction. EXAM: PORTABLE ABDOMEN - 1 VIEW COMPARISON:  Abdominal x-ray dated Dec 27, 2018. FINDINGS: Interval removal of the enteric tube. Persistent small bowel dilatation in the central abdomen. No acute osseous abnormality. IMPRESSION: Unchanged small bowel obstruction. Electronically Signed   By: Titus Dubin M.D.   On: 12/29/2018 09:27        Scheduled Meds: . [MAR Hold] enoxaparin (LOVENOX) injection  40 mg Subcutaneous Q24H  . [MAR Hold] lip balm  1 application Topical BID   Continuous Infusions: . [MAR Hold] ciprofloxacin    . dextrose 5 % and 0.45 % NaCl with KCl 40 mEq/L 100 mL/hr at 12/30/18 0604  . lactated ringers 75 mL/hr at 12/30/18 1225  . [MAR Hold] methocarbamol (ROBAXIN) IV Stopped (12/28/18 1510)  . [MAR Hold] ondansetron (ZOFRAN) IV       LOS:  4 days     Cordelia Poche, MD [facility] 12/30/2018, 12:36 PM  If 7PM-7AM, please contact night-coverage [email or web address]

## 2018-12-31 ENCOUNTER — Encounter (HOSPITAL_COMMUNITY): Payer: Self-pay | Admitting: Surgery

## 2018-12-31 DIAGNOSIS — D649 Anemia, unspecified: Secondary | ICD-10-CM

## 2018-12-31 DIAGNOSIS — E669 Obesity, unspecified: Secondary | ICD-10-CM

## 2018-12-31 DIAGNOSIS — R739 Hyperglycemia, unspecified: Secondary | ICD-10-CM

## 2018-12-31 LAB — BASIC METABOLIC PANEL
Anion gap: 8 (ref 5–15)
BUN: 17 mg/dL (ref 6–20)
CO2: 27 mmol/L (ref 22–32)
Calcium: 8.7 mg/dL — ABNORMAL LOW (ref 8.9–10.3)
Chloride: 104 mmol/L (ref 98–111)
Creatinine, Ser: 1.07 mg/dL — ABNORMAL HIGH (ref 0.44–1.00)
GFR calc Af Amer: >60 mL/min (ref >60)
GFR calc non Af Amer: 57 mL/min — ABNORMAL LOW (ref >60)
Glucose, Bld: 130 mg/dL — ABNORMAL HIGH (ref 70–99)
Potassium: 4.6 mmol/L (ref 3.5–5.1)
Sodium: 139 mmol/L (ref 135–145)

## 2018-12-31 LAB — CBC
HCT: 38.8 % (ref 36.0–46.0)
Hemoglobin: 11.9 g/dL — ABNORMAL LOW (ref 12.0–15.0)
MCH: 30.4 pg (ref 26.0–34.0)
MCHC: 30.7 g/dL (ref 30.0–36.0)
MCV: 99 fL (ref 80.0–100.0)
Platelets: 235 10*3/uL (ref 150–400)
RBC: 3.92 MIL/uL (ref 3.87–5.11)
RDW: 13.2 % (ref 11.5–15.5)
WBC: 8.7 10*3/uL (ref 4.0–10.5)
nRBC: 0 % (ref 0.0–0.2)

## 2018-12-31 LAB — GLUCOSE, CAPILLARY: Glucose-Capillary: 146 mg/dL — ABNORMAL HIGH (ref 70–99)

## 2018-12-31 MED ORDER — SODIUM CHLORIDE 0.9% FLUSH
10.0000 mL | Freq: Two times a day (BID) | INTRAVENOUS | Status: DC
  Administered 2019-01-01 – 2019-01-03 (×3): 10 mL

## 2018-12-31 MED ORDER — OXYCODONE HCL 5 MG PO TABS: 5.0000 mg | ORAL_TABLET | ORAL | Status: DC | PRN

## 2018-12-31 MED ORDER — HYDROCODONE-ACETAMINOPHEN 7.5-325 MG PO TABS
1.0000 | ORAL_TABLET | ORAL | Status: DC | PRN
  Administered 2018-12-31 – 2019-01-05 (×2): 1 via ORAL
  Filled 2018-12-31 (×2): qty 1

## 2018-12-31 MED ORDER — DEXTROSE-NACL 5-0.45 % IV SOLN
INTRAVENOUS | Status: DC
  Administered 2018-12-31 – 2019-01-01 (×3): via INTRAVENOUS

## 2018-12-31 MED ORDER — SODIUM CHLORIDE 0.9% FLUSH
10.0000 mL | INTRAVENOUS | Status: DC | PRN
  Administered 2019-01-06: 10 mL
  Filled 2018-12-31: qty 40

## 2018-12-31 NOTE — Progress Notes (Signed)
PROGRESS NOTE    Danielle Griffin  AVW:[id number] DOB: [birth year] DOA: 12/25/2018 PCP: [facility]   Brief Narrative:  Is a 58 year old African-American obese female with a past medical history significant for but not limited to essential hypertension, anemia, history of cystitis, history of leukopenia, menorrhagia, history of allergic rhinitis, and other comorbidities who presented with a chief complaint of abdominal pain.  Further work-up showed that she had a small bowel obstruction and NG tube was placed.  Because she failed conservative management and medical therapy is she was taken to the OR for an exploratory laparotomy lysis of adhesions on 12/30/2018.  She is postoperative day 1 and complaining of some abdominal pain.  Assessment & Plan:   Principal Problem:   Abdominal pain Active Problems:   Essential hypertension   Hypokalemia   AKI (acute kidney injury) (HCC)   Normocytic anemia   Hyperglycemia   Obesity (BMI 30-39.9)  Small Bowel Obstruction status post exploratory laparotomy and lysis of adhesions by Dr. Ninfa Linden on 12/30/2018 -Has had multiple risk factors for small bowel obstruction given her extensive surgical history of tubal ligation and robotic assisted laparoscopic vaginal hysterectomy -CT Abdomen and Pelvis w/o Contrast showed that there were numerous loops of distended small bowel in the mid abdomen with the largest loops measuring up to 4 cm.  The distal small bowel was decompressed.  Findings were consistent with small bowel obstruction and then transition point was difficult to identify although suspected to be in the vicinity of the left adnexa -NG tube placed and continuing -Failed clamp trial previously and hadMinimal BM with suppository. -NG tube came out the day before yesterday accidentally.  -Unfortunately failed conservative therapies and medical management and was taken for surgical intervention with a exploratory laparotomy and lysis  of adhesions she is postoperative day 1 -General surgery recommendations:  Await return of bowel function, ambulate, incentive spirometry, pain control as well as continuing NG T until return of bowel function is likely she will have a postop ileus -Currently pain is controlled with hydrocodone-acetaminophen 1 tab p.o. every 4 as needed moderate pain as well as IV morphine PCA with naloxone -Supportive care with antiemetics with p.o./IV Zofran and Compazine -Continue with Robaxin thousand milligrams IV every 6 hours PRN for muscle spasms -Currently getting D5 half-normal saline with 40 mEq of potassium chloride at 100 mL's per hour and will just change to D5 half-normal saline at 100 mL's per hour without potassium given her improvement in Potassium  -Diet advancement per general surgery once bowel function returns but is currently n.p.o. except meds and sips  Acute Kidney Injury, improving -Unknown baseline. Creatinine of 2.4 on admission.  -Trending down with IV fluids and BUN/Cr is now 17/1.07. -D5 1/2 NS IV fluids w/ potassium as above changed to just D5 1/2 NS  -Avoid nephrotoxic medications, contrast dyes as well as hypotension -Continue to monitor and trend renal function and repeat CMP in the a.m.  Essential Hypertension -On Hydrochlorothiazide and Olmesartan as an outpatient.  -Held secondary to AKI and dehydration -C/w IVF hydration as abobe -Continue Hydralazine 10 mg IV q4hprn  Hypokalemia, improved -Patient's potassium went from 3.0 and now is 4.6 -We will stop D5 half-normal saline with 40 mEq of potassium chloride given her improvement of potassium -Continue monitor and replete as necessary -Repeat CMP in a.m.  Normocytic Anemia -Patient's potassium went from 12.7/40.5 is now 11.9/38.8 -Likely in setting of dilutional drop and postsurgical drop -Check anemia panel in a.m. -  Continue to monitor for signs and symptoms of bleeding -Continue monitor and trend CBC   Hyperglycemia -In the setting of D5 IVF -Patient's glucose on BMP is been ranging from 119-147  -CBG's ranging from 92-146 -Check HbA1c -Continue monitor and trend blood sugars and if remain consistently elevated will place on sensitive NovoLog/scale insulin AC  Obesity -Estimated body mass index is 37.79 kg/m as calculated from the following:   Height as of this encounter: 5\' 5"  (1.651 m).   Weight as of this encounter: 103 kg. -Weight Loss and Dietary Counseling given   DVT prophylaxis: Enoxaparin 40 mg sq q24h Code Status: FULL CODE Family Communication: No family present at bedside  Disposition Plan: Pending Tolerance of Diet and General Surgery Clearance  Consultants:   General Surgery   Procedures:  NGT (4/30 -> Current)  Antimicrobials:  Anti-infectives (From admission, onward)   Start     Dose/Rate Route Frequency Ordered Stop   12/30/18 1200  ciprofloxacin (CIPRO) IVPB 400 mg    Note to Pharmacy:  preop For surgery today   400 mg 200 mL/hr over 60 Minutes Intravenous  Once 12/30/18 0826 12/30/18 1530     Subjective: Seen and examined at bedside and states that her abdomen was sore from surgical intervention.  No nausea or vomiting but still not passing gas.  About to ambulate to the restroom to try to have a bowel movement.  No nausea or vomiting.  No other concerns or complaints at this time.  Objective: Vitals:   12/31/18 0406 12/31/18 0532 12/31/18 0628 12/31/18 1031  BP:  (!) 110/91  125/81  Pulse:  83  95  Resp: 12 16  18   Temp:  (!) 97.4 F (36.3 C)  98.1 F (36.7 C)  TempSrc:  Oral  Oral  SpO2: 95% 94%    Weight:   103 kg   Height:        Intake/Output Summary (Last 24 hours) at 12/31/2018 1117 Last data filed at 12/31/2018 0735 Gross per 24 hour  Intake 3765.59 ml  Output 1995 ml  Net 1770.59 ml   Filed Weights   12/29/18 0600 12/30/18 0527 12/31/18 0628  Weight: 100 kg 96.7 kg 103 kg   Examination: Physical Exam:  Constitutional:  WN/WD obese AAF in NAD and appears calm but somewhat uncomfortable Eyes: Lids and conjunctivae normal, sclerae anicteric  ENMT: External Ears, Nose appear normal. Grossly normal hearing. Mucous membranes are moist.  Neck: Appears normal, supple, no cervical masses, normal ROM, no appreciable thyromegaly; no JVD Respiratory: Diminished to auscultation bilaterally slightly, no wheezing, rales, rhonchi or crackles. Normal respiratory effort and patient is not tachypenic. No accessory muscle use.  Cardiovascular: RRR, no murmurs / rubs / gallops. S1 and S2 auscultated. Trace extremity edema. 2+ pedal pulses. No carotid bruits.  Abdomen: Soft, Tender to palpate, Distended. Has a Honeycomb dressing that has some dried blood. Bowel sounds positive x4.  GU: Deferred. Musculoskeletal: No clubbing / cyanosis of digits/nails. No joint deformity upper and lower extremities.  Skin: No rashes, lesions, ulcers on a limited skin evaluation. No induration; Warm and dry.  Neurologic: CN 2-12 grossly intact with no focal deficits. . Romberg sign and cerebellar reflexes not assessed.  Psychiatric: Normal judgment and insight. Alert and oriented x 3. Normal mood and appropriate affect.   Data Reviewed: I have personally reviewed following labs and imaging studies  CBC: Recent Labs  Lab 12/25/18 0751 12/26/18 0334 12/29/18 0349 12/31/18 0408  WBC 5.6 4.8 7.3 8.7  NEUTROABS 3.8  --   --   --   HGB 14.6 13.9 12.7 11.9*  HCT 46.5* 43.7 40.5 38.8  MCV 95.7 97.3 95.7 99.0  PLT 278 230 257 098   Basic Metabolic Panel: Recent Labs  Lab 12/27/18 1300 12/28/18 0858 12/29/18 0349 12/29/18 1639 12/30/18 1724 12/31/18 0408  NA 139 141 138  --  138 139  K 3.5 2.9* 3.0* 3.0* 4.4 4.6  CL 96* 95* 97*  --  102 104  CO2 29 32 31  --  27 27  GLUCOSE 163* 123* 147*  --  119* 130*  BUN 27* 29* 26*  --  18 17  CREATININE 1.50* 1.49* 1.26*  --  1.25* 1.07*  CALCIUM 9.5 9.4 9.0  --  8.4* 8.7*   GFR: Estimated  Creatinine Clearance: 67.4 mL/min (A) (by C-G formula based on SCr of 1.07 mg/dL (H)). Liver Function Tests: Recent Labs  Lab 12/25/18 0751 12/26/18 0334  AST 25 22  ALT 17 15  ALKPHOS 66 59  BILITOT 1.0 1.1  PROT 8.2* 6.8  ALBUMIN 4.6 3.8   Recent Labs  Lab 12/25/18 0751  LIPASE 26   No results for input(s): AMMONIA in the last 168 hours. Coagulation Profile: No results for input(s): INR, PROTIME in the last 168 hours. Cardiac Enzymes: No results for input(s): CKTOTAL, CKMB, CKMBINDEX, TROPONINI in the last 168 hours. BNP (last 3 results) No results for input(s): PROBNP in the last 8760 hours. HbA1C: No results for input(s): HGBA1C in the last 72 hours. CBG: Recent Labs  Lab 12/27/18 0854 12/28/18 0731 12/29/18 0724 12/30/18 0736 12/31/18 0747  GLUCAP 160* 142* 92 121* 146*   Lipid Profile: No results for input(s): CHOL, HDL, LDLCALC, TRIG, CHOLHDL, LDLDIRECT in the last 72 hours. Thyroid Function Tests: No results for input(s): TSH, T4TOTAL, FREET4, T3FREE, THYROIDAB in the last 72 hours. Anemia Panel: No results for input(s): VITAMINB12, FOLATE, FERRITIN, TIBC, IRON, RETICCTPCT in the last 72 hours. Sepsis Labs: No results for input(s): PROCALCITON, LATICACIDVEN in the last 168 hours.  Recent Results (from the past 240 hour(s))  MRSA PCR Screening     Status: None   Collection Time: 12/30/18  8:56 AM  Result Value Ref Range Status   MRSA by PCR NEGATIVE NEGATIVE Final    Comment:        The GeneXpert MRSA Assay (FDA approved for NASAL specimens only), is one component of a comprehensive MRSA colonization surveillance program. It is not intended to diagnose MRSA infection nor to guide or monitor treatment for MRSA infections. Performed at [facility], [place] [address], [place], [place] [address]      Radiology Studies: Dg Abd Portable 1v  Result Date: 12/30/2018 CLINICAL DATA:  Follow-up small bowel obstruction EXAM: PORTABLE  ABDOMEN - 1 VIEW COMPARISON:  Yesterday FINDINGS: Continued prominent small bowel distension with loops measuring up to 5.7 cm diameter. The degree of distention has increased. No concerning mass effect or gas collection. IMPRESSION: Small bowel obstruction with progressive distention. Electronically Signed   By: Monte Fantasia M.D.   On: 12/30/2018 04:47   Scheduled Meds: . enoxaparin (LOVENOX) injection  40 mg Subcutaneous Q24H  . lip balm  1 application Topical BID  . morphine   Intravenous Q4H  . sodium chloride flush  10-40 mL Intracatheter Q12H   Continuous Infusions: . dextrose 5 % and 0.45% NaCl    . methocarbamol (ROBAXIN) IV Stopped (12/28/18 1510)  . ondansetron (ZOFRAN) IV  LOS: 5 days   Kerney Elbe, DO [facility] PAGER is on [place]  If 7PM-7AM, please contact night-coverage [email or web address] Password [email or web address] 12/31/2018, 11:17 AM

## 2018-12-31 NOTE — Progress Notes (Signed)
Writer @ bedside for am lab draw. Ext. Dwell catheter not present. Per patient removed prior to surgery yesterday. Lab on floor and notified that patient am labs need to be drawn.

## 2018-12-31 NOTE — Progress Notes (Signed)
Midline site assessment: R midline beeping occluded with arm movement, per RN. Noted infusing ran freely with arm abducted.  Dressing changed, line pulled back. Brisk blood return noted. No longer occluding with position changes.

## 2018-12-31 NOTE — Progress Notes (Signed)
[facility]/Trauma Progress Note  1 Day Post-Op   Assessment/Plan Principal Problem: Abdominal pain Active Problems: Essential hypertension Hypokalemia AKI (acute kidney injury) ([facility])  SBO -Surg hx = tubal ligation, & robotic assisted laparoscopic vaginal hysterectomy  - S/P ex lap, LOA, Dr. Ninfa Linden, 05/05 - will likely have a post op ileus, continue NGT until return of bowel function  FEN:NPO, NGT to LIWS VTE: SCD's, lovenox ZT:IWPYK pre-op Foley:DC today Follow up:Dr. Ninfa Linden  DISPO:await return of bowel function, ambulate, IS, pain control    LOS: 5 days    Subjective: CC: abdominal pain  IV came out overnight. Nurse called IV team to replace. Pt states she walked last night. No flatus or BM today. She is sitting up in the chair.   Objective: Vital signs in last 24 hours: Temp:  [97.4 F (36.3 C)-98.5 F (36.9 C)] 97.4 F (36.3 C) (05/06 0532) Pulse Rate:  [77-100] 83 (05/06 0532) Resp:  [12-25] 16 (05/06 0532) BP: (110-144)/(78-97) 110/91 (05/06 0532) SpO2:  [88 %-98 %] 94 % (05/06 0532) FiO2 (%):  [45 %] 45 % (05/06 0012) Weight:  [103 kg] 103 kg (05/06 0628) Last BM Date: 12/29/18  Intake/Output from previous day: 05/05 0701 - 05/06 0700 In: 4157.3 [P.O.:180; I.V.:3527.3; IV Piggyback:450] Out: 9983 [Urine:370; Emesis/NG output:325; Blood:50] Intake/Output this shift: Total I/O In: -  Out: 250 [Urine:200; Emesis/NG output:50]  PE: Gen: Alert, NAD, pleasant, cooperative Pulm:Rate andeffort normal Abd: Soft,obese,ND, no BS appreciated, incision with honeycomb in place. Generalized TTP with mild guarding, no peritonitis   Skin: no rashes noted, warm and dry  Anti-infectives: Anti-infectives (From admission, onward)   Start     Dose/Rate Route Frequency Ordered Stop   12/30/18 1200  ciprofloxacin (CIPRO) IVPB 400 mg    Note to Pharmacy:  preop For surgery today   400 mg 200 mL/hr over 60 Minutes Intravenous   Once 12/30/18 0826 12/30/18 1530      Lab Results:  Recent Labs    12/29/18 0349 12/31/18 0408  WBC 7.3 8.7  HGB 12.7 11.9*  HCT 40.5 38.8  PLT 257 235   BMET Recent Labs    12/30/18 1724 12/31/18 0408  NA 138 139  K 4.4 4.6  CL 102 104  CO2 27 27  GLUCOSE 119* 130*  BUN 18 17  CREATININE 1.25* 1.07*  CALCIUM 8.4* 8.7*   PT/INR No results for input(s): LABPROT, INR in the last 72 hours. CMP     Component Value Date/Time   NA 139 12/31/2018 0408   K 4.6 12/31/2018 0408   CL 104 12/31/2018 0408   CO2 27 12/31/2018 0408   GLUCOSE 130 (H) 12/31/2018 0408   BUN 17 12/31/2018 0408   CREATININE 1.07 (H) 12/31/2018 0408   CALCIUM 8.7 (L) 12/31/2018 0408   PROT 6.8 12/26/2018 0334   ALBUMIN 3.8 12/26/2018 0334   AST 22 12/26/2018 0334   ALT 15 12/26/2018 0334   ALKPHOS 59 12/26/2018 0334   BILITOT 1.1 12/26/2018 0334   GFRNONAA 57 (L) 12/31/2018 0408   GFRAA >60 12/31/2018 0408   Lipase     Component Value Date/Time   LIPASE 26 12/25/2018 0751    Studies/Results: Dg Abd Portable 1v  Result Date: 12/30/2018 CLINICAL DATA:  Follow-up small bowel obstruction EXAM: PORTABLE ABDOMEN - 1 VIEW COMPARISON:  Yesterday FINDINGS: Continued prominent small bowel distension with loops measuring up to 5.7 cm diameter. The degree of distention has increased. No concerning mass effect or gas collection. IMPRESSION:  Small bowel obstruction with progressive distention. Electronically Signed   By: Monte Fantasia M.D.   On: 12/30/2018 04:47   Dg Abd Portable 1v  Result Date: 12/29/2018 CLINICAL DATA:  Small bowel obstruction. EXAM: PORTABLE ABDOMEN - 1 VIEW COMPARISON:  Abdominal x-ray dated Dec 27, 2018. FINDINGS: Interval removal of the enteric tube. Persistent small bowel dilatation in the central abdomen. No acute osseous abnormality. IMPRESSION: Unchanged small bowel obstruction. Electronically Signed   By: Titus Dubin M.D.   On: 12/29/2018 09:27      Kalman Drape ,  [facility] 12/31/2018, 8:18 AM  Pager: [phone] Mon-Wed, Friday 7:00am-4:30pm Thurs 7am-11:30am  Consults: [phone]

## 2019-01-01 LAB — COMPREHENSIVE METABOLIC PANEL
ALT: 35 U/L (ref 0–44)
AST: 28 U/L (ref 15–41)
Albumin: 3.3 g/dL — ABNORMAL LOW (ref 3.5–5.0)
Alkaline Phosphatase: 79 U/L (ref 38–126)
Anion gap: 6 (ref 5–15)
BUN: 15 mg/dL (ref 6–20)
CO2: 29 mmol/L (ref 22–32)
Calcium: 8.6 mg/dL — ABNORMAL LOW (ref 8.9–10.3)
Chloride: 104 mmol/L (ref 98–111)
Creatinine, Ser: 1.08 mg/dL — ABNORMAL HIGH (ref 0.44–1.00)
GFR calc Af Amer: >60 mL/min (ref >60)
GFR calc non Af Amer: 56 mL/min — ABNORMAL LOW (ref >60)
Glucose, Bld: 108 mg/dL — ABNORMAL HIGH (ref 70–99)
Potassium: 3.7 mmol/L (ref 3.5–5.1)
Sodium: 139 mmol/L (ref 135–145)
Total Bilirubin: 0.8 mg/dL (ref 0.3–1.2)
Total Protein: 6 g/dL — ABNORMAL LOW (ref 6.5–8.1)

## 2019-01-01 LAB — CBC WITH DIFFERENTIAL/PLATELET
Abs Immature Granulocytes: 0.23 10*3/uL — ABNORMAL HIGH (ref 0.00–0.07)
Basophils Absolute: 0 10*3/uL (ref 0.0–0.1)
Basophils Relative: 0 %
Eosinophils Absolute: 0.2 10*3/uL (ref 0.0–0.5)
Eosinophils Relative: 2 %
HCT: 32.4 % — ABNORMAL LOW (ref 36.0–46.0)
Hemoglobin: 10 g/dL — ABNORMAL LOW (ref 12.0–15.0)
Immature Granulocytes: 3 %
Lymphocytes Relative: 18 %
Lymphs Abs: 1.3 10*3/uL (ref 0.7–4.0)
MCH: 30.7 pg (ref 26.0–34.0)
MCHC: 30.9 g/dL (ref 30.0–36.0)
MCV: 99.4 fL (ref 80.0–100.0)
Monocytes Absolute: 0.9 10*3/uL (ref 0.1–1.0)
Monocytes Relative: 11 %
Neutro Abs: 4.9 10*3/uL (ref 1.7–7.7)
Neutrophils Relative %: 66 %
Platelets: 217 10*3/uL (ref 150–400)
RBC: 3.26 MIL/uL — ABNORMAL LOW (ref 3.87–5.11)
RDW: 13.3 % (ref 11.5–15.5)
WBC: 7.5 10*3/uL (ref 4.0–10.5)
nRBC: 0 % (ref 0.0–0.2)

## 2019-01-01 LAB — IRON AND TIBC
Iron: 29 ug/dL (ref 28–170)
Saturation Ratios: 13 % (ref 10.4–31.8)
TIBC: 220 ug/dL — ABNORMAL LOW (ref 250–450)
UIBC: 191 ug/dL

## 2019-01-01 LAB — FOLATE: Folate: 14.1 ng/mL (ref >5.9)

## 2019-01-01 LAB — MAGNESIUM: Magnesium: 2.3 mg/dL (ref 1.7–2.4)

## 2019-01-01 LAB — RETICULOCYTES
Immature Retic Fract: 18.8 % — ABNORMAL HIGH (ref 2.3–15.9)
RBC.: 3.26 MIL/uL — ABNORMAL LOW (ref 3.87–5.11)
Retic Count, Absolute: 92.6 10*3/uL (ref 19.0–186.0)
Retic Ct Pct: 2.8 % (ref 0.4–3.1)

## 2019-01-01 LAB — FERRITIN: Ferritin: 484 ng/mL — ABNORMAL HIGH (ref 11–307)

## 2019-01-01 LAB — HEMOGLOBIN A1C
Hgb A1c MFr Bld: 5 % (ref 4.8–5.6)
Mean Plasma Glucose: 96.8 mg/dL

## 2019-01-01 LAB — PHOSPHORUS: Phosphorus: 3.1 mg/dL (ref 2.5–4.6)

## 2019-01-01 LAB — VITAMIN B12: Vitamin B-12: 608 pg/mL (ref 180–914)

## 2019-01-01 LAB — GLUCOSE, CAPILLARY: Glucose-Capillary: 78 mg/dL (ref 70–99)

## 2019-01-01 NOTE — Progress Notes (Signed)
[facility]/Trauma Progress Note  2 Days Post-Op   Assessment/Plan Principal Problem: Abdominal pain Active Problems: Essential hypertension Hypokalemia AKI (acute kidney injury) (HCC)  SBO -Surg hx = tubal ligation, & robotic assisted laparoscopic vaginal hysterectomy  - S/P ex lap, LOA, Dr. Ninfa Linden, 05/05 - will likely have a post op ileus, continue NGT until return of bowel function - if no return of bowel function by tomorrow will need to start TPN  FEN:NPO, NGT to LIWS VTE: SCD's, lovenox CH:ENIDP pre-op Foley:none Follow up:Dr. Ninfa Linden  DISPO:await return of bowel function, ambulate, IS, pain control   LOS: 6 days    Subjective: CC: abdominal soreness  No flatus or BM. PCA is working. Pt has been walking. I talked her to son, Tavarus, on the phone about awaiting return of bowel function. Pt denies fever or chills overnight.   Objective: Vital signs in last 24 hours: Temp:  [98.1 F (36.7 C)-98.7 F (37.1 C)] 98.7 F (37.1 C) (05/07 0505) Pulse Rate:  [90-101] 98 (05/07 0505) Resp:  [12-20] 20 (05/07 0742) BP: (120-137)/(69-82) 137/76 (05/07 0505) SpO2:  [94 %-100 %] 94 % (05/07 0742) FiO2 (%):  [43 %] 43 % (05/06 1608) Weight:  [103.7 kg] 103.7 kg (05/07 0505) Last BM Date: 12/24/18(per pt)  Intake/Output from previous day: 05/06 0701 - 05/07 0700 In: 1891.7 [P.O.:120; I.V.:1771.7] Out: 750 [Urine:550; Emesis/NG output:200] Intake/Output this shift: No intake/output data recorded.  PE: Gen: Alert, NAD, pleasant, cooperative Pulm:Rate andeffort normal Abd: Soft,obese,ND, no BS appreciated, incision with honeycomb in place. Generalized TTP with mild guarding, no peritonitis  Skin: no rashes noted, warm and dry   Anti-infectives: Anti-infectives (From admission, onward)   Start     Dose/Rate Route Frequency Ordered Stop   12/30/18 1200  ciprofloxacin (CIPRO) IVPB 400 mg    Note to Pharmacy:  preop For  surgery today   400 mg 200 mL/hr over 60 Minutes Intravenous  Once 12/30/18 0826 12/30/18 1530      Lab Results:  Recent Labs    12/31/18 0408 01/01/19 0458  WBC 8.7 7.5  HGB 11.9* 10.0*  HCT 38.8 32.4*  PLT 235 217   BMET Recent Labs    12/31/18 0408 01/01/19 0458  NA 139 139  K 4.6 3.7  CL 104 104  CO2 27 29  GLUCOSE 130* 108*  BUN 17 15  CREATININE 1.07* 1.08*  CALCIUM 8.7* 8.6*   PT/INR No results for input(s): LABPROT, INR in the last 72 hours. CMP     Component Value Date/Time   NA 139 01/01/2019 0458   K 3.7 01/01/2019 0458   CL 104 01/01/2019 0458   CO2 29 01/01/2019 0458   GLUCOSE 108 (H) 01/01/2019 0458   BUN 15 01/01/2019 0458   CREATININE 1.08 (H) 01/01/2019 0458   CALCIUM 8.6 (L) 01/01/2019 0458   PROT 6.0 (L) 01/01/2019 0458   ALBUMIN 3.3 (L) 01/01/2019 0458   AST 28 01/01/2019 0458   ALT 35 01/01/2019 0458   ALKPHOS 79 01/01/2019 0458   BILITOT 0.8 01/01/2019 0458   GFRNONAA 56 (L) 01/01/2019 0458   GFRAA >60 01/01/2019 0458   Lipase     Component Value Date/Time   LIPASE 26 12/25/2018 0751    Studies/Results: No results found.    Kalman Drape , [facility] Surgery 01/01/2019, 8:35 AM  Pager: [phone] Mon-Wed, Friday 7:00am-4:30pm Thurs 7am-11:30am  Consults: [phone]

## 2019-01-01 NOTE — Progress Notes (Signed)
PROGRESS NOTE    Danielle Griffin  TIW:[id number] DOB: May 31, 1961 DOA: 12/25/2018 PCP: [facility]   Brief Narrative:  Is a 58 year old African-American obese female with a past medical history significant for but not limited to essential hypertension, anemia, history of cystitis, history of leukopenia, menorrhagia, history of allergic rhinitis, and other comorbidities who presented with a chief complaint of abdominal pain.  Further work-up showed that she had a small bowel obstruction and NG tube was placed.  Because she failed conservative management and medical therapy is she was taken to the OR for an exploratory laparotomy lysis of adhesions on 12/30/2018.  She is postoperative day 2 and no longer complaining of Abdominal Pain but has not passed gass or had a bowel movement. Foley has been removed and her R Midline was occluded and line pulled back yesterday.  Per general surgery if bowel function does not return tomorrow she will be needing to start TPN.  Assessment & Plan:   Principal Problem:   Abdominal pain Active Problems:   Essential hypertension   Hypokalemia   AKI (acute kidney injury) (HCC)   Normocytic anemia   Hyperglycemia   Obesity (BMI 30-39.9)  Small Bowel Obstruction status post exploratory laparotomy and lysis of adhesions by Dr. Ninfa Griffin on 12/30/2018 -Has had multiple risk factors for small bowel obstruction given her extensive surgical history of tubal ligation and robotic assisted laparoscopic vaginal hysterectomy -CT Abdomen and Pelvis w/o Contrast showed that there were numerous loops of distended small bowel in the mid abdomen with the largest loops measuring up to 4 cm.  The distal small bowel was decompressed.  Findings were consistent with small bowel obstruction and then transition point was difficult to identify although suspected to be in the vicinity of the left adnexa -NG tube placed and continuing -Failed clamp trial previously and had  Minimal BM with suppository so was taken to Surgery -NG tube came out 12/28/2018 accidentally and has now been Replaced -Unfortunately failed conservative therapies and medical management and was taken for surgical intervention with a exploratory laparotomy and lysis of adhesions she is postoperative Day 2 -General surgery recommendations:  Await return of bowel function, ambulate, incentive spirometry, pain control as well as continuing NGT until return of bowel function is likely she will have a postop ileus; per general surgery if no return of bowel function by tomorrow she will need to start TPN -Currently pain is controlled with hydrocodone-acetaminophen 1 tab p.o. every 4 as needed moderate pain as well as IV morphine PCA with naloxone (to be continued until tomorrow per Nurse discussion with Surgery) -Supportive care with antiemetics with p.o./IV Zofran and Compazine -Continue with Robaxin thousand milligrams IV every 6 hours PRN for muscle spasms -Currently getting D5 half-normal saline with at 100 mL's per hour and -Diet advancement per general surgery once bowel function returns but is currently n.p.o. except meds and sips  Acute Kidney Injury, improved  -Unknown baseline. Creatinine of 2.4 on admission.  -Trending down with IV fluids and BUN/Cr is now 17/1.07 -> 15/1.08. -D5 1/2 NS IV fluids w/ potassium as above changed to just D5 1/2 NS yesterday  -Avoid nephrotoxic medications, contrast dyes as well as hypotension -Continue to monitor and trend renal function and repeat CMP in the a.m.  Essential Hypertension -On Hydrochlorothiazide and Olmesartan as an outpatient.  -Held secondary to AKI and dehydration -C/w IVF hydration as above -Continue Hydralazine 10 mg IV q4hprn -BP was now 137/76  Hypokalemia, improved -Patient's  potassium went from 3.0 and now is 3.7 -Continue monitor and replete as necessary -Repeat CMP in a.m.  Normocytic Anemia -Patient's potassium went from  12.7/40.5 -> 11.9/38.8 -> 10.0/32.4 -Likely in setting of dilutional drop and postsurgical drop -Checked Anemia Panel showed iron level of 29, U IBC 191, TIBC of 220, saturation ratios of 13%, ferritin level 484, folate level 14.1, and vitamin B12 level of 608 -Continue to monitor for signs and symptoms of bleeding -Continue monitor and trend CBC  Hyperglycemia -In the setting of D5 IVF -Patient's glucose on BMP is been ranging from 119-147  -CBG's ranging from 78-146 -Checked HbA1c and was 5.0 -Continue monitor and trend blood sugars and if remain consistently elevated will place on sensitive NovoLog/scale insulin AC  Obesity -Estimated body mass index is 38.04 kg/m as calculated from the following:   Height as of this encounter: 5\' 5"  (1.651 m).   Weight as of this encounter: 103.7 kg. -Weight Loss and Dietary Counseling given   DVT prophylaxis: Enoxaparin 40 mg sq q24h Code Status: FULL CODE Family Communication: No family present at bedside  Disposition Plan: Pending Tolerance of Diet and General Surgery Clearance  Consultants:   General Surgery   Procedures:  NGT (4/30 -> Current)  Antimicrobials:  Anti-infectives (From admission, onward)   Start     Dose/Rate Route Frequency Ordered Stop   12/30/18 1200  ciprofloxacin (CIPRO) IVPB 400 mg    Note to Pharmacy:  preop For surgery today   400 mg 200 mL/hr over 60 Minutes Intravenous  Once 12/30/18 0826 12/30/18 1530     Subjective: Seen and examined at bedside and she was sitting in the chair bedside and denied any abdominal pain currently.  States that she has not passed any gas or had any bowel months but is ambulating.  Still having output in her NG canister and had 700 cc.  Denies any chest pain, lightheadedness or dizziness.  No other concerns or complaints at this time.  Objective: Vitals:   01/01/19 0505 01/01/19 0740 01/01/19 0742 01/01/19 0909  BP: 137/76     Pulse: 98     Resp: 15 12 20 15   Temp: 98.7  F (37.1 C)     TempSrc: Oral     SpO2: 100% 100% 94% 100%  Weight: 103.7 kg     Height:        Intake/Output Summary (Last 24 hours) at 01/01/2019 1111 Last data filed at 01/01/2019 0945 Gross per 24 hour  Intake 1891.67 ml  Output 700 ml  Net 1191.67 ml   Filed Weights   12/30/18 0527 12/31/18 0628 01/01/19 0505  Weight: 96.7 kg 103 kg 103.7 kg   Examination: Physical Exam:  Constitutional: Well-nourished, well-developed obese African-American female currently no acute distress appears calm and sitting up in a chair little bit more comfortable today Eyes: Lids and conjunctive are normal.  Sclera anicteric ENMT: External ears nose appear normal.  Grossly normal hearing.  Mucous murmurs are moist Neck: Appears supple no JVD Respiratory: Diminished auscultation bilaterally no appreciable wheezing, rales, capital patient not tachypneic or using any accessory muscle breathe Cardiovascular: Regular rate and rhythm.  No appreciable murmurs, rubs, gallops.  Has trace extremity edema. Abdomen: Soft, mildly tender to palpate.  Distended second but habitus.  Has a honeycomb dressing has some dried blood.  Bowel sounds are diminished GU: Deferred Musculoskeletal: No contractures or cyanosis.  No joint deformities upper extremity Skin: No appreciable rashes or lesions on to skin evaluation but  does have a honeycomb dressing on her abdomen Neurologic: Cranial nerves II through XII grossly intact no appreciable focal deficits.  Romberg sign is cerebellar reflexes were not assessed Psychiatric: Is a pleasant mood and affect.  Intact judgment and insight.  Patient is awake, alert, oriented x3.  Data Reviewed: I have personally reviewed following labs and imaging studies  CBC: Recent Labs  Lab 12/26/18 0334 12/29/18 0349 12/31/18 0408 01/01/19 0458  WBC 4.8 7.3 8.7 7.5  NEUTROABS  --   --   --  4.9  HGB 13.9 12.7 11.9* 10.0*  HCT 43.7 40.5 38.8 32.4*  MCV 97.3 95.7 99.0 99.4  PLT 230 257  235 578   Basic Metabolic Panel: Recent Labs  Lab 12/28/18 0858 12/29/18 0349 12/29/18 1639 12/30/18 1724 12/31/18 0408 01/01/19 0458  NA 141 138  --  138 139 139  K 2.9* 3.0* 3.0* 4.4 4.6 3.7  CL 95* 97*  --  102 104 104  CO2 32 31  --  27 27 29   GLUCOSE 123* 147*  --  119* 130* 108*  BUN 29* 26*  --  18 17 15   CREATININE 1.49* 1.26*  --  1.25* 1.07* 1.08*  CALCIUM 9.4 9.0  --  8.4* 8.7* 8.6*  MG  --   --   --   --   --  2.3  PHOS  --   --   --   --   --  3.1   GFR: Estimated Creatinine Clearance: 67 mL/min (A) (by C-G formula based on SCr of 1.08 mg/dL (H)). Liver Function Tests: Recent Labs  Lab 12/26/18 0334 01/01/19 0458  AST 22 28  ALT 15 35  ALKPHOS 59 79  BILITOT 1.1 0.8  PROT 6.8 6.0*  ALBUMIN 3.8 3.3*   No results for input(s): LIPASE, AMYLASE in the last 168 hours. No results for input(s): AMMONIA in the last 168 hours. Coagulation Profile: No results for input(s): INR, PROTIME in the last 168 hours. Cardiac Enzymes: No results for input(s): CKTOTAL, CKMB, CKMBINDEX, TROPONINI in the last 168 hours. BNP (last 3 results) No results for input(s): PROBNP in the last 8760 hours. HbA1C: Recent Labs    01/01/19 0458  HGBA1C 5.0   CBG: Recent Labs  Lab 12/28/18 0731 12/29/18 0724 12/30/18 0736 12/31/18 0747 01/01/19 0729  GLUCAP 142* 92 121* 146* 78   Lipid Profile: No results for input(s): CHOL, HDL, LDLCALC, TRIG, CHOLHDL, LDLDIRECT in the last 72 hours. Thyroid Function Tests: No results for input(s): TSH, T4TOTAL, FREET4, T3FREE, THYROIDAB in the last 72 hours. Anemia Panel: Recent Labs    01/01/19 0458  VITAMINB12 608  FOLATE 14.1  FERRITIN 484*  TIBC 220*  IRON 29  RETICCTPCT 2.8   Sepsis Labs: No results for input(s): PROCALCITON, LATICACIDVEN in the last 168 hours.  Recent Results (from the past 240 hour(s))  MRSA PCR Screening     Status: None   Collection Time: 12/30/18  8:56 AM  Result Value Ref Range Status   MRSA by  PCR NEGATIVE NEGATIVE Final    Comment:        The GeneXpert MRSA Assay (FDA approved for NASAL specimens only), is one component of a comprehensive MRSA colonization surveillance program. It is not intended to diagnose MRSA infection nor to guide or monitor treatment for MRSA infections. Performed at [facility], [place] [address] [place], [place] [address]      Radiology Studies: No results found. Scheduled Meds: . enoxaparin (  LOVENOX) injection  40 mg Subcutaneous Q24H  . lip balm  1 application Topical BID  . morphine   Intravenous Q4H  . sodium chloride flush  10-40 mL Intracatheter Q12H   Continuous Infusions: . dextrose 5 % and 0.45% NaCl 100 mL/hr at 01/01/19 0918  . methocarbamol (ROBAXIN) IV Stopped (12/28/18 1510)  . ondansetron (ZOFRAN) IV      LOS: 6 days   Kerney Elbe, DO [facility] PAGER is on AMION  If 7PM-7AM, please contact night-coverage [email or web address] Password [email or web address] 01/01/2019, 11:11 AM

## 2019-01-02 ENCOUNTER — Inpatient Hospital Stay: Payer: Self-pay

## 2019-01-02 DIAGNOSIS — K567 Ileus, unspecified: Secondary | ICD-10-CM

## 2019-01-02 DIAGNOSIS — K9189 Other postprocedural complications and disorders of digestive system: Secondary | ICD-10-CM

## 2019-01-02 LAB — COMPREHENSIVE METABOLIC PANEL
ALT: 35 U/L (ref 0–44)
AST: 40 U/L (ref 15–41)
Albumin: 3.1 g/dL — ABNORMAL LOW (ref 3.5–5.0)
Alkaline Phosphatase: 109 U/L (ref 38–126)
Anion gap: 5 (ref 5–15)
BUN: 11 mg/dL (ref 6–20)
CO2: 29 mmol/L (ref 22–32)
Calcium: 8.7 mg/dL — ABNORMAL LOW (ref 8.9–10.3)
Chloride: 105 mmol/L (ref 98–111)
Creatinine, Ser: 1.01 mg/dL — ABNORMAL HIGH (ref 0.44–1.00)
GFR calc Af Amer: >60 mL/min (ref >60)
GFR calc non Af Amer: >60 mL/min (ref >60)
Glucose, Bld: 104 mg/dL — ABNORMAL HIGH (ref 70–99)
Potassium: 3.3 mmol/L — ABNORMAL LOW (ref 3.5–5.1)
Sodium: 139 mmol/L (ref 135–145)
Total Bilirubin: 0.9 mg/dL (ref 0.3–1.2)
Total Protein: 5.8 g/dL — ABNORMAL LOW (ref 6.5–8.1)

## 2019-01-02 LAB — CBC WITH DIFFERENTIAL/PLATELET
Abs Immature Granulocytes: 0.14 10*3/uL — ABNORMAL HIGH (ref 0.00–0.07)
Basophils Absolute: 0 10*3/uL (ref 0.0–0.1)
Basophils Relative: 1 %
Eosinophils Absolute: 0.2 10*3/uL (ref 0.0–0.5)
Eosinophils Relative: 3 %
HCT: 31.3 % — ABNORMAL LOW (ref 36.0–46.0)
Hemoglobin: 9.5 g/dL — ABNORMAL LOW (ref 12.0–15.0)
Immature Granulocytes: 2 %
Lymphocytes Relative: 15 %
Lymphs Abs: 1 10*3/uL (ref 0.7–4.0)
MCH: 29.7 pg (ref 26.0–34.0)
MCHC: 30.4 g/dL (ref 30.0–36.0)
MCV: 97.8 fL (ref 80.0–100.0)
Monocytes Absolute: 0.7 10*3/uL (ref 0.1–1.0)
Monocytes Relative: 10 %
Neutro Abs: 4.4 10*3/uL (ref 1.7–7.7)
Neutrophils Relative %: 69 %
Platelets: 234 10*3/uL (ref 150–400)
RBC: 3.2 MIL/uL — ABNORMAL LOW (ref 3.87–5.11)
RDW: 13 % (ref 11.5–15.5)
WBC: 6.4 10*3/uL (ref 4.0–10.5)
nRBC: 0 % (ref 0.0–0.2)

## 2019-01-02 LAB — MAGNESIUM: Magnesium: 2.1 mg/dL (ref 1.7–2.4)

## 2019-01-02 LAB — PHOSPHORUS: Phosphorus: 3.3 mg/dL (ref 2.5–4.6)

## 2019-01-02 LAB — GLUCOSE, CAPILLARY
Glucose-Capillary: 104 mg/dL — ABNORMAL HIGH (ref 70–99)
Glucose-Capillary: 132 mg/dL — ABNORMAL HIGH (ref 70–99)
Glucose-Capillary: 91 mg/dL (ref 70–99)

## 2019-01-02 MED ORDER — SODIUM CHLORIDE 0.9% FLUSH: 10.0000 mL | INTRAVENOUS | Status: DC | PRN

## 2019-01-02 MED ORDER — ACETAMINOPHEN 10 MG/ML IV SOLN
1000.0000 mg | Freq: Four times a day (QID) | INTRAVENOUS | Status: AC
  Administered 2019-01-02 – 2019-01-03 (×4): 1000 mg via INTRAVENOUS
  Filled 2019-01-02 (×4): qty 100

## 2019-01-02 MED ORDER — DEXTROSE-NACL 5-0.45 % IV SOLN
INTRAVENOUS | Status: AC
  Administered 2019-01-02 – 2019-01-03 (×2): via INTRAVENOUS

## 2019-01-02 MED ORDER — INSULIN ASPART 100 UNIT/ML ~~LOC~~ SOLN
0.0000 [IU] | Freq: Four times a day (QID) | SUBCUTANEOUS | Status: DC
  Administered 2019-01-02 – 2019-01-04 (×3): 1 [IU] via SUBCUTANEOUS

## 2019-01-02 MED ORDER — MENTHOL 3 MG MT LOZG
1.0000 | LOZENGE | OROMUCOSAL | Status: DC | PRN
  Filled 2019-01-02: qty 9

## 2019-01-02 MED ORDER — MORPHINE SULFATE (PF) 2 MG/ML IV SOLN: 2.0000 mg | INTRAVENOUS | Status: DC | PRN

## 2019-01-02 MED ORDER — POTASSIUM CHLORIDE 10 MEQ/100ML IV SOLN
10.0000 meq | INTRAVENOUS | Status: AC
  Administered 2019-01-02 (×4): 10 meq via INTRAVENOUS
  Filled 2019-01-02: qty 100

## 2019-01-02 MED ORDER — SODIUM CHLORIDE 0.9% FLUSH
10.0000 mL | Freq: Two times a day (BID) | INTRAVENOUS | Status: DC
  Administered 2019-01-03 (×2): 10 mL

## 2019-01-02 MED ORDER — TRAVASOL 10 % IV SOLN
INTRAVENOUS | Status: AC
  Administered 2019-01-02: 18:00:00 via INTRAVENOUS
  Filled 2019-01-02: qty 528

## 2019-01-02 NOTE — Progress Notes (Signed)
[facility]/Trauma Progress Note  3 Days Post-Op   Assessment/Plan Principal Problem: Abdominal pain Active Problems: Essential hypertension Hypokalemia AKI (acute kidney injury) (HCC)  SBO -Surg hx =tubal ligation,&robotic assisted laparoscopic vaginal hysterectomy  -S/P ex lap, LOA, Dr. Ninfa Linden, 05/05 post op ileus - continue NGT until return of bowel function - start TPN  FEN:NPO, NGT to LIWS, TPN VTE: SCD's, lovenox RU:EAVWU pre-op Foley:none Follow up:Dr. Ninfa Linden  DISPO:await return of bowel function, ambulate, IS, pain control, TPN   LOS: 7 days    Subjective: CC: abdominal soreness   Pt has been walking and walked this am. She has not had flatus or BM. No fever or chills overnight. No issues overnight.   Objective: Vital signs in last 24 hours: Temp:  [97.5 F (36.4 C)-98.2 F (36.8 C)] 97.5 F (36.4 C) (05/08 0524) Pulse Rate:  [92-107] 105 (05/08 0524) Resp:  [13-18] 15 (05/08 0741) BP: (129-139)/(80-113) 139/83 (05/08 0524) SpO2:  [89 %-100 %] 100 % (05/08 0741) Weight:  [101.8 kg] 101.8 kg (05/08 0500) Last BM Date: 12/24/18  Intake/Output from previous day: 05/07 0701 - 05/08 0700 In: 3380 [P.O.:280; I.V.:3100] Out: 1800 [Urine:1050; Emesis/NG output:750] Intake/Output this shift: No intake/output data recorded.  PE: Gen: Alert, NAD, pleasant, cooperative HEENT: NGT with dark brown output Cardio: RRR Pulm:Rate andeffort normal Abd: Soft,obese,ND,fewBSappreciated,incision with honeycomb in place. Mild generalized TTP without guarding, no peritonitis Skin: no rashes noted, warm and dry   Anti-infectives: Anti-infectives (From admission, onward)   Start     Dose/Rate Route Frequency Ordered Stop   12/30/18 1200  ciprofloxacin (CIPRO) IVPB 400 mg    Note to Pharmacy:  preop For surgery today   400 mg 200 mL/hr over 60 Minutes Intravenous  Once 12/30/18 0826 12/30/18 1530      Lab  Results:  Recent Labs    01/01/19 0458 01/02/19 0453  WBC 7.5 6.4  HGB 10.0* 9.5*  HCT 32.4* 31.3*  PLT 217 234   BMET Recent Labs    01/01/19 0458 01/02/19 0453  NA 139 139  K 3.7 3.3*  CL 104 105  CO2 29 29  GLUCOSE 108* 104*  BUN 15 11  CREATININE 1.08* 1.01*  CALCIUM 8.6* 8.7*   PT/INR No results for input(s): LABPROT, INR in the last 72 hours. CMP     Component Value Date/Time   NA 139 01/02/2019 0453   K 3.3 (L) 01/02/2019 0453   CL 105 01/02/2019 0453   CO2 29 01/02/2019 0453   GLUCOSE 104 (H) 01/02/2019 0453   BUN 11 01/02/2019 0453   CREATININE 1.01 (H) 01/02/2019 0453   CALCIUM 8.7 (L) 01/02/2019 0453   PROT 5.8 (L) 01/02/2019 0453   ALBUMIN 3.1 (L) 01/02/2019 0453   AST 40 01/02/2019 0453   ALT 35 01/02/2019 0453   ALKPHOS 109 01/02/2019 0453   BILITOT 0.9 01/02/2019 0453   GFRNONAA >60 01/02/2019 0453   GFRAA >60 01/02/2019 0453   Lipase     Component Value Date/Time   LIPASE 26 12/25/2018 0751    Studies/Results: No results found.    Kalman Drape , [facility] Surgery 01/02/2019, 8:24 AM  Pager: [phone] Mon-Wed, Friday 7:00am-4:30pm Thurs 7am-11:30am  Consults: [phone]

## 2019-01-02 NOTE — Progress Notes (Signed)
Initial Nutrition Assessment  DOCUMENTATION CODES:   Obesity unspecified  INTERVENTION:   TPN per Pharmacy  NUTRITION DIAGNOSIS:   Inadequate oral intake related to inability to eat(prolonged ileus) as evidenced by NPO status.  GOAL:   Patient will meet greater than or equal to 90% of their needs  MONITOR:   Labs, Weight trends, I & O's(TPN)  REASON FOR ASSESSMENT:   Consult New TPN/TNA  ASSESSMENT:   58 year old African-American obese female with a past medical history significant for but not limited to essential hypertension, anemia, history of cystitis, history of leukopenia, menorrhagia, history of allergic rhinitis, and other comorbidities who presented with a chief complaint of abdominal pain.  Admitted for SBO.    5/1: admitted, CT suggests SBO, pt made NPO, NGT placed for suction 5/4: NGT came out, diet advanced to CLD-did not tolerate 5/5: s/p Ex lap, LOA, NGT replaced, NPO 5/6: surgery suspects pt to have prolonged ileus  **RD working remotely**  Patient reports N/V and diarrhea that started after eating [employer] on 4/27. At admission, pt did not report any weight loss. Pt has not sustained any nutrition for 11 days now.   Per surgery, plan is for PICC to be placed and TPN to be started today. Per Pharmacy, TPN to begin at 40 ml/hr (providing 988 kcal, 52g protein). NGT still in place, output x 24 hours: 750 ml.  Per weight records, pt weighed 225 lb in February 2020. During this admission, pt weighed 220 lb on 5/1. Pt's weight has fluctuated, lowest weight recorded was 213 lb on 5/5, now is trending upwards.   Medications: D5-.45% NaCl infusion at 60 ml/hr, KCl infusion, Labs reviewed: CBGs: 104 Low K Mg/Phos WNL  NUTRITION - FOCUSED PHYSICAL EXAM:  Unable to perform per department requirements to work remotely.  Diet Order:   Diet Order            Diet NPO time specified Except for: Ice Chips  Diet effective now              EDUCATION  NEEDS:   No education needs have been identified at this time  Skin:  Skin Assessment: Reviewed RN Assessment  Last BM:  4/29  Height:   Ht Readings from Last 1 Encounters:  12/25/18 5\' 5"  (1.651 m)    Weight:   Wt Readings from Last 1 Encounters:  01/02/19 101.8 kg    Ideal Body Weight:  56.8 kg  BMI:  Body mass index is 37.35 kg/m.  Estimated Nutritional Needs:   Kcal:  1700-1900  Protein:  70-80g  Fluid:  1.9L/day  Clayton Bibles, MS, RD, LDN [place] [facility] Pager: [phone] After Hours Pager: [phone]

## 2019-01-02 NOTE — Progress Notes (Signed)
[place] NOTE   Pharmacy Consult for TPN Indication: prolonged ileus  Patient Measurements: Height: 5\' 5"  (165.1 cm) Weight: 224 lb 6.9 oz (101.8 kg) IBW/kg (Calculated) : 57 TPN AdjBW (KG): 70 Body mass index is 37.35 kg/m. Usual Weight:   Insulin Requirements: none  Current Nutrition: NPO  IVF: D545NS 100 ml/hr  Central access: PICC ordered 5/8 for TPN TPN start date: 5/8  ASSESSMENT                                                                                                          HPI: Surg hx =tubal ligation,&robotic assisted laparoscopic vaginal hysterectomy  -S/P ex lap, LOA, Dr. Ninfa Linden, 05/05  Significant events:   Today:    Glucose: no hx DM  Electrolytes: K 3.3- 4 runs per CCS (goal >=4) phos 3.3 mag 2.1 (goal >=2)  Renal:AKI resolving   LFTs:WNL  TGs:  Prealbumin:  NUTRITIONAL GOALS                                                                                             RD recs: pending  Custom TPN at goal rate of 80 ml/hr provides: -  105.6 g/day protein (55 g/L) -   57.6 g/day Lipid  ( 30  g/L) -  288 g/day Dextrose ( 15 %) -  1978 Kcal/day  PLAN                                                                                                                        4 runs K per CCS  At 1800 today:  Start TPN at 40 ml/hr and assess tolerance - goal rate of 80 ml/hr provides 105.6 g of protein, 288 g of dextrose, and 57.6 g of lipids which provides 1978 kCals per day, meeting 100 % of goal kcal and goal AA  Electrolytes in TPN: Standard, Cl:Ac ratio 1:1  Plan to advance as tolerated to the goal rate.  TPN to contain standard multivitamins and trace elements on MWF only due to national shortage  Reduce IVF to 60 ml/hr when TPN started at 40 ml/hr to keep IVF 100 ml/hr  or per CCS orders  Add sSSI q6h   TPN lab panels on Mondays & Thursdays.  F/u daily.   Eudelia Bunch,  Pharm.D 01/02/2019 9:35 AM

## 2019-01-02 NOTE — Progress Notes (Signed)
PROGRESS NOTE    Danielle Griffin  WNU:[id number] DOB: [birth year] DOA: 12/25/2018 PCP: [facility]   Brief Narrative:  Is a 58 year old African-American obese female with a past medical history significant for but not limited to essential hypertension, anemia, history of cystitis, history of leukopenia, menorrhagia, history of allergic rhinitis, and other comorbidities who presented with a chief complaint of abdominal pain.  Further work-up showed that she had a small bowel obstruction and NG tube was placed.  Because she failed conservative management and medical therapy is she was taken to the OR for an exploratory laparotomy lysis of adhesions on 12/30/2018.  She is postoperative day 2 and no longer complaining of Abdominal Pain but has not passed gass or had a bowel movement. Foley has been removed and her R Midline was occluded and line pulled back yesterday.  Per general surgery since bowel function did not return today she will be started on TPN for prolonged ileus.   Assessment & Plan:   Principal Problem:   Abdominal pain Active Problems:   Essential hypertension   Hypokalemia   AKI (acute kidney injury) (HCC)   Normocytic anemia   Hyperglycemia   Obesity (BMI 30-39.9)  Small Bowel Obstruction status post exploratory laparotomy and lysis of adhesions by Dr. Ninfa Linden on 0/10/4740 now complicated with Prolonged Ileus  -Has had multiple risk factors for small bowel obstruction given her extensive surgical history of tubal ligation and robotic assisted laparoscopic vaginal hysterectomy -CT Abdomen and Pelvis w/o Contrast showed that there were numerous loops of distended small bowel in the mid abdomen with the largest loops measuring up to 4 cm.  The distal small bowel was decompressed.  Findings were consistent with small bowel obstruction and then transition point was difficult to identify although suspected to be in the vicinity of the left adnexa -NG tube placed  and continuing for now until Bowel  -Failed clamp trial previously and had Minimal BM with suppository so was taken to Surgery for Intervention  -NG tube came out 12/28/2018 accidentally and has now been Replaced and is still continuing  -Unfortunately failed conservative therapies and medical management and was taken for surgical intervention with a exploratory laparotomy and lysis of adhesions she is postoperative Day 3 -General surgery recommendations:  Await return of bowel function, ambulate, incentive spirometry, pain control as well as continuing NGT until return of bowel function as likely she will have a postop ileus which is now prolonged; Patient is to start TPN today given Prolonged Ileus  -Currently pain is controlled with hydrocodone-acetaminophen 1 tab p.o. every 4 as needed moderate pain; IV morphine PCA with naloxone now discontinued and she is on IV Morphine 2-4 mg q2hprn -Supportive care with antiemetics with p.o./IV Zofran and Compazine -Continue with Robaxin thousand milligrams IV every 6 hours PRN for muscle spasms -Was getting D5 half-normal saline with at 100 mL's per hour and this will be reduced to 60 mL/hr since she will be starting TPN today  -Diet advancement per general surgery once bowel function returns but is currently n.p.o. except meds and sips and now about to start TPN  Acute Kidney Injury, improved  -Unknown baseline. Creatinine of 2.4 on admission.  -Trending down with IV fluids and BUN/Cr is now 11/1.01 -D5 1/2 NS to be changed to 60 mL/hr later this evening  -Avoid nephrotoxic medications, contrast dyes as well as hypotension -Continue to monitor and trend renal function and repeat CMP in the a.m.  Essential Hypertension -On  Hydrochlorothiazide and Olmesartan as an outpatient.  -Held secondary to AKI and dehydration -C/w IVF hydration as above -Continue Hydralazine 10 mg IV q4hprn -BP was now 139/83  Hypokalemia, improved -Patient's potassium went  from 3.3 -Replete with IV KCl 40 mEQ -Continue monitor and replete as necessary -Repeat CMP in a.m.  Normocytic Anemia -Patient's potassium went from 12.7/40.5 -> 11.9/38.8 -> 10.0/32.4 -> 9.5/31.3 -Likely in setting of dilutional drop and postsurgical drop -Checked Anemia Panel showed iron level of 29, U IBC 191, TIBC of 220, saturation ratios of 13%, ferritin level 484, folate level 14.1, and vitamin B12 level of 608 -Continue to monitor for signs and symptoms of bleeding carefully -Continue monitor and trend CBC  Hyperglycemia -In the setting of D5 IVF -Patient's glucose on BMP is been ranging from 104-147  -CBG's ranging from 78-104 -Checked HbA1c and was 5.0 -Continue monitor and trend blood sugars and if remain consistently elevated will place on sensitive NovoLog/scale insulin AC  Obesity -Estimated body mass index is 37.35 kg/m as calculated from the following:   Height as of this encounter: 5\' 5"  (1.651 m).   Weight as of this encounter: 101.8 kg. -Weight Loss and Dietary Counseling given   DVT prophylaxis: Enoxaparin 40 mg sq q24h Code Status: FULL CODE Family Communication: No family present at bedside  Disposition Plan: Pending Tolerance of Diet and General Surgery Clearance; Now Getting TPN today given prolonged ileus   Consultants:   General Surgery   Procedures:  NGT (4/30 -> Current) -PICC Placement with TPN  Antimicrobials:  Anti-infectives (From admission, onward)   Start     Dose/Rate Route Frequency Ordered Stop   12/30/18 1200  ciprofloxacin (CIPRO) IVPB 400 mg    Note to Pharmacy:  preop For surgery today   400 mg 200 mL/hr over 60 Minutes Intravenous  Once 12/30/18 0826 12/30/18 1530     Subjective: Seen and examined at bedside and was sitting up in bed in had no complaints but still has not had return of bowel function so would be getting it PICC line and TPN started today.  No chest pain, lightheadedness or dizziness.  States that her pain  is under control currently.  No other concerns or complaints at this time.  Objective: Vitals:   01/02/19 0417 01/02/19 0500 01/02/19 0524 01/02/19 0741  BP:   139/83   Pulse:   (!) 105   Resp: 14  14 15   Temp:   (!) 97.5 F (36.4 C)   TempSrc:   Oral   SpO2: 100%  95% 100%  Weight:  101.8 kg    Height:        Intake/Output Summary (Last 24 hours) at 01/02/2019 1126 Last data filed at 01/02/2019 0600 Gross per 24 hour  Intake 2330 ml  Output 1600 ml  Net 730 ml   Filed Weights   12/31/18 0628 01/01/19 0505 01/02/19 0500  Weight: 103 kg 103.7 kg 101.8 kg   Examination: Physical Exam:  Constitutional: Well-nourished, well-developed obese African-American female currently no acute distress appears calm and sitting up in bed waiting for her PICC line placed and asking when her NG tube can be removed. Eyes: Sclera anicteric.  Lids and conjunctive are normal ENMT: External ears and nose appear normal.  Grossly normal hearing.  Extremities are moist.  Has a NG tube in place Neck: Appears supple no JVD Respiratory: Slightly diminished auscultation bilaterally no appreciable wheezing, rales, rhonchi.  Patient was not tachypneic or using any accessory muscles breathe  Cardiovascular: Slightly tachycardic rate but regular rhythm.  No appreciable murmurs, rubs, gallops.  Has mild lower extremity edema Abdomen: Soft, nontender, distended second body habitus.  Has a honeycomb dressing on her abdomen with some dried blood but is appearing intact GU: Deferred.  Foley catheter is now been removed Musculoskeletal: No appreciable contractures or cyanosis.  No joint deformities in the upper or lower extremities Skin: Skin is warm and dry with no appreciable rashes or lesions but does have a honeycomb dressing on her abdomen that is clean dry and intact Neurologic: Cranial nerves II through XII grossly intact no appreciable focal deficits.  Romberg sign cerebellar reflexes were not assessed  Psychiatric: Has a normal mood and affect.  Intact judgment insight.  She is awake and alert and oriented x3  Data Reviewed: I have personally reviewed following labs and imaging studies  CBC: Recent Labs  Lab 12/29/18 0349 12/31/18 0408 01/01/19 0458 01/02/19 0453  WBC 7.3 8.7 7.5 6.4  NEUTROABS  --   --  4.9 4.4  HGB 12.7 11.9* 10.0* 9.5*  HCT 40.5 38.8 32.4* 31.3*  MCV 95.7 99.0 99.4 97.8  PLT 257 235 217 160   Basic Metabolic Panel: Recent Labs  Lab 12/29/18 0349 12/29/18 1639 12/30/18 1724 12/31/18 0408 01/01/19 0458 01/02/19 0453  NA 138  --  138 139 139 139  K 3.0* 3.0* 4.4 4.6 3.7 3.3*  CL 97*  --  102 104 104 105  CO2 31  --  27 27 29 29   GLUCOSE 147*  --  119* 130* 108* 104*  BUN 26*  --  18 17 15 11   CREATININE 1.26*  --  1.25* 1.07* 1.08* 1.01*  CALCIUM 9.0  --  8.4* 8.7* 8.6* 8.7*  MG  --   --   --   --  2.3 2.1  PHOS  --   --   --   --  3.1 3.3   GFR: Estimated Creatinine Clearance: 70.9 mL/min (A) (by C-G formula based on SCr of 1.01 mg/dL (H)). Liver Function Tests: Recent Labs  Lab 01/01/19 0458 01/02/19 0453  AST 28 40  ALT 35 35  ALKPHOS 79 109  BILITOT 0.8 0.9  PROT 6.0* 5.8*  ALBUMIN 3.3* 3.1*   No results for input(s): LIPASE, AMYLASE in the last 168 hours. No results for input(s): AMMONIA in the last 168 hours. Coagulation Profile: No results for input(s): INR, PROTIME in the last 168 hours. Cardiac Enzymes: No results for input(s): CKTOTAL, CKMB, CKMBINDEX, TROPONINI in the last 168 hours. BNP (last 3 results) No results for input(s): PROBNP in the last 8760 hours. HbA1C: Recent Labs    01/01/19 0458  HGBA1C 5.0   CBG: Recent Labs  Lab 12/29/18 0724 12/30/18 0736 12/31/18 0747 01/01/19 0729 01/02/19 0801  GLUCAP 92 121* 146* 78 104*   Lipid Profile: No results for input(s): CHOL, HDL, LDLCALC, TRIG, CHOLHDL, LDLDIRECT in the last 72 hours. Thyroid Function Tests: No results for input(s): TSH, T4TOTAL, FREET4,  T3FREE, THYROIDAB in the last 72 hours. Anemia Panel: Recent Labs    01/01/19 0458  VITAMINB12 608  FOLATE 14.1  FERRITIN 484*  TIBC 220*  IRON 29  RETICCTPCT 2.8   Sepsis Labs: No results for input(s): PROCALCITON, LATICACIDVEN in the last 168 hours.  Recent Results (from the past 240 hour(s))  MRSA PCR Screening     Status: None   Collection Time: 12/30/18  8:56 AM  Result Value Ref Range Status   MRSA  by PCR NEGATIVE NEGATIVE Final    Comment:        The GeneXpert MRSA Assay (FDA approved for NASAL specimens only), is one component of a comprehensive MRSA colonization surveillance program. It is not intended to diagnose MRSA infection nor to guide or monitor treatment for MRSA infections. Performed at [facility], [place] [address], [place], [place] [address]      Radiology Studies: [place] Ekg Site Rite  Result Date: 01/02/2019 If [facility] image not attached, placement could not be confirmed due to current cardiac rhythm.  Scheduled Meds: . enoxaparin (LOVENOX) injection  40 mg Subcutaneous Q24H  . insulin aspart  0-9 Units Subcutaneous Q6H  . lip balm  1 application Topical BID  . sodium chloride flush  10-40 mL Intracatheter Q12H   Continuous Infusions: . acetaminophen    . dextrose 5 % and 0.45% NaCl    . methocarbamol (ROBAXIN) IV Stopped (12/28/18 1510)  . ondansetron (ZOFRAN) IV    . potassium chloride 10 mEq (01/02/19 0913)  . TPN ADULT (ION)      LOS: 7 days   Kerney Elbe, DO [facility] PAGER is on AMION  If 7PM-7AM, please contact night-coverage [email or web address] Password [facility] 01/02/2019, 11:26 AM

## 2019-01-02 NOTE — Progress Notes (Signed)
Peripherally Inserted Central Catheter/Midline Placement  The IV Nurse has discussed with the patient and/or persons authorized to consent for the patient, the purpose of this procedure and the potential benefits and risks involved with this procedure.  The benefits include less needle sticks, lab draws from the catheter, and the patient may be discharged home with the catheter. Risks include, but not limited to, infection, bleeding, blood clot (thrombus formation), and puncture of an artery; nerve damage and irregular heartbeat and possibility to perform a PICC exchange if needed/ordered by physician.  Alternatives to this procedure were also discussed.  Bard Power PICC patient education guide, fact sheet on infection prevention and patient information card has been provided to patient /or left at bedside.    PICC/Midline Placement Documentation  PICC Double Lumen 49/35/52 PICC Left Basilic 45 cm 1 cm (Active)  Indication for Insertion or Continuance of Line Administration of hyperosmolar/irritating solutions (i.e. TPN, Vancomycin, etc.) 01/02/2019  5:00 PM  Exposed Catheter (cm) 1 cm 01/02/2019  5:00 PM  Site Assessment Clean;Dry;Intact 01/02/2019  5:00 PM  Lumen #1 Status Flushed;Blood return noted 01/02/2019  5:00 PM  Lumen #2 Status Flushed;Blood return noted 01/02/2019  5:00 PM  Dressing Type Transparent 01/02/2019  5:00 PM  Dressing Status Clean;Dry;Intact;Antimicrobial disc in place 01/02/2019  5:00 PM  Dressing Change Due 01/09/19 01/02/2019  5:00 PM       Jule Economy Horton 01/02/2019, 5:45 PM

## 2019-01-03 ENCOUNTER — Inpatient Hospital Stay (HOSPITAL_COMMUNITY): Payer: BLUE CROSS/BLUE SHIELD

## 2019-01-03 DIAGNOSIS — R945 Abnormal results of liver function studies: Secondary | ICD-10-CM

## 2019-01-03 LAB — GLUCOSE, CAPILLARY
Glucose-Capillary: 105 mg/dL — ABNORMAL HIGH (ref 70–99)
Glucose-Capillary: 116 mg/dL — ABNORMAL HIGH (ref 70–99)
Glucose-Capillary: 148 mg/dL — ABNORMAL HIGH (ref 70–99)
Glucose-Capillary: 86 mg/dL (ref 70–99)

## 2019-01-03 LAB — CBC WITH DIFFERENTIAL/PLATELET
Abs Immature Granulocytes: 0.12 10*3/uL — ABNORMAL HIGH (ref 0.00–0.07)
Basophils Absolute: 0 10*3/uL (ref 0.0–0.1)
Basophils Relative: 0 %
Eosinophils Absolute: 0.2 10*3/uL (ref 0.0–0.5)
Eosinophils Relative: 2 %
HCT: 30.7 % — ABNORMAL LOW (ref 36.0–46.0)
Hemoglobin: 9.6 g/dL — ABNORMAL LOW (ref 12.0–15.0)
Immature Granulocytes: 2 %
Lymphocytes Relative: 13 %
Lymphs Abs: 0.9 10*3/uL (ref 0.7–4.0)
MCH: 30.2 pg (ref 26.0–34.0)
MCHC: 31.3 g/dL (ref 30.0–36.0)
MCV: 96.5 fL (ref 80.0–100.0)
Monocytes Absolute: 0.5 10*3/uL (ref 0.1–1.0)
Monocytes Relative: 8 %
Neutro Abs: 5.1 10*3/uL (ref 1.7–7.7)
Neutrophils Relative %: 75 %
Platelets: 231 10*3/uL (ref 150–400)
RBC: 3.18 MIL/uL — ABNORMAL LOW (ref 3.87–5.11)
RDW: 13.1 % (ref 11.5–15.5)
WBC: 6.8 10*3/uL (ref 4.0–10.5)
nRBC: 0 % (ref 0.0–0.2)

## 2019-01-03 LAB — COMPREHENSIVE METABOLIC PANEL
ALT: 45 U/L — ABNORMAL HIGH (ref 0–44)
AST: 64 U/L — ABNORMAL HIGH (ref 15–41)
Albumin: 3 g/dL — ABNORMAL LOW (ref 3.5–5.0)
Alkaline Phosphatase: 170 U/L — ABNORMAL HIGH (ref 38–126)
Anion gap: 10 (ref 5–15)
BUN: 12 mg/dL (ref 6–20)
CO2: 29 mmol/L (ref 22–32)
Calcium: 9 mg/dL (ref 8.9–10.3)
Chloride: 100 mmol/L (ref 98–111)
Creatinine, Ser: 1.03 mg/dL — ABNORMAL HIGH (ref 0.44–1.00)
GFR calc Af Amer: >60 mL/min (ref >60)
GFR calc non Af Amer: 59 mL/min — ABNORMAL LOW (ref >60)
Glucose, Bld: 126 mg/dL — ABNORMAL HIGH (ref 70–99)
Potassium: 3.2 mmol/L — ABNORMAL LOW (ref 3.5–5.1)
Sodium: 139 mmol/L (ref 135–145)
Total Bilirubin: 1.3 mg/dL — ABNORMAL HIGH (ref 0.3–1.2)
Total Protein: 5.8 g/dL — ABNORMAL LOW (ref 6.5–8.1)

## 2019-01-03 LAB — TRIGLYCERIDES: Triglycerides: 190 mg/dL — ABNORMAL HIGH (ref <150)

## 2019-01-03 LAB — MAGNESIUM: Magnesium: 2 mg/dL (ref 1.7–2.4)

## 2019-01-03 LAB — PHOSPHORUS: Phosphorus: 3.3 mg/dL (ref 2.5–4.6)

## 2019-01-03 LAB — PREALBUMIN: Prealbumin: 12 mg/dL — ABNORMAL LOW (ref 18–38)

## 2019-01-03 MED ORDER — POTASSIUM CHLORIDE 10 MEQ/50ML IV SOLN
10.0000 meq | INTRAVENOUS | Status: AC
  Administered 2019-01-03 (×4): 10 meq via INTRAVENOUS
  Filled 2019-01-03 (×5): qty 50

## 2019-01-03 MED ORDER — DEXTROSE-NACL 5-0.45 % IV SOLN: INTRAVENOUS | Status: AC

## 2019-01-03 MED ORDER — METOCLOPRAMIDE HCL 5 MG/ML IJ SOLN: 5.0000 mg | Freq: Three times a day (TID) | INTRAMUSCULAR | Status: AC | PRN

## 2019-01-03 MED ORDER — TRAVASOL 10 % IV SOLN
INTRAVENOUS | Status: AC
  Administered 2019-01-03: 17:00:00 via INTRAVENOUS
  Filled 2019-01-03: qty 806.4

## 2019-01-03 NOTE — Progress Notes (Signed)
4 Days Post-Op   Subjective/Chief Complaint: No complaints but wants ng out   Objective: Vital signs in last 24 hours: Temp:  [97.5 F (36.4 C)-99.1 F (37.3 C)] 99.1 F (37.3 C) (05/09 0504) Pulse Rate:  [83-89] 83 (05/09 0504) Resp:  [16-18] 16 (05/09 0504) BP: (145-156)/(85-87) 156/85 (05/09 0504) SpO2:  [95 %-100 %] 97 % (05/09 0504) Weight:  [104.9 kg] 104.9 kg (05/09 0504) Last BM Date: 12/24/18  Intake/Output from previous day: 05/08 0701 - 05/09 0700 In: 2223.2 [I.V.:2069.9; IV Piggyback:153.3] Out: 4332 [Urine:650; Emesis/NG output:1000] Intake/Output this shift: No intake/output data recorded.  General appearance: alert and cooperative Resp: clear to auscultation bilaterally Cardio: regular rate and rhythm GI: soft, mild tenderness. quiet. no flatus  Lab Results:  Recent Labs    01/02/19 0453 01/03/19 0439  WBC 6.4 6.8  HGB 9.5* 9.6*  HCT 31.3* 30.7*  PLT 234 231   BMET Recent Labs    01/02/19 0453 01/03/19 0439  NA 139 139  K 3.3* 3.2*  CL 105 100  CO2 29 29  GLUCOSE 104* 126*  BUN 11 12  CREATININE 1.01* 1.03*  CALCIUM 8.7* 9.0   PT/INR No results for input(s): LABPROT, INR in the last 72 hours. ABG No results for input(s): PHART, HCO3 in the last 72 hours.  Invalid input(s): PCO2, PO2  Studies/Results: [place] Ekg Site Rite  Result Date: 01/02/2019 If Site Rite image not attached, placement could not be confirmed due to current cardiac rhythm.   Anti-infectives: Anti-infectives (From admission, onward)   Start     Dose/Rate Route Frequency Ordered Stop   12/30/18 1200  ciprofloxacin (CIPRO) IVPB 400 mg    Note to Pharmacy:  preop For surgery today   400 mg 200 mL/hr over 60 Minutes Intravenous  Once 12/30/18 0826 12/30/18 1530      Assessment/Plan: s/p Procedure(s): EXPLORATORY LAPAROTOMY, LYSIS OF ADHESIONS (N/A) continue ng and bowel rest for ileus. Consider adding reglan ambulate  LOS: 8 days    Autumn Messing III 01/03/2019

## 2019-01-03 NOTE — Progress Notes (Signed)
[place] NOTE   Pharmacy Consult for TPN Indication: prolonged ileus  Patient Measurements: Height: '5\' 5"'  (165.1 cm) Weight: 231 lb 4.2 oz (104.9 kg) IBW/kg (Calculated) : 57 TPN AdjBW (KG): 70 Body mass index is 38.48 kg/m. Usual Weight:   Insulin Requirements: 1 U SSI  Current Nutrition: NPO, TPN at 40 ml/hr  IVF: D545NS 60 ml/hr   Central access: PICC placed 5/8 for TPN TPN start date: 5/8  ASSESSMENT                                                                                                          HPI: Surg hx =tubal ligation,&robotic assisted laparoscopic vaginal hysterectomy  -S/P ex lap, LOA, Dr. Ninfa Linden, 05/05  Significant events:   Today:    Glucose: no hx DM, 1 U SSI, CBGS < 150 on TPN at 40 ml/hr  Electrolytes: K 3.2 after- 4 runs yesterday (goal >=4) phos 3.3 mag 2.0 (goal >=2)  Renal:AKI resolving   LFTs: AST/ALT, Alk phos slightly elevated, T bili 1.3  TGs: 190 5/9  Prealbumin: 12 5/9   NUTRITIONAL GOALS                                                                                             RD recs: 01/02/19  Kcal:  1700-1900 Protein:  70-80g Fluid:  1.9L/day  Custom TPN at goal rate of 80 ml/hr provides: -  80.64 g/day protein (42 g/L) -   48 g/day Lipid  (25 g/L) -  288 g/day Dextrose ( 15 %) -  1782 Kcal/day  PLAN                                                                                                                   Now:      4 runs K   At 1800 today:  Increase TPN to 80 ml/hr  - goal rate of 80 ml/hr provides 80.6 g of protein, 288 g of dextrose, and 48 g of lipids which provides 1782 kCals per day, meeting 100 % of goal kcal and goal AA  Electrolytes in TPN: Standard x K 60, Cl:Ac ratio 1:1  TPN to  contain standard multivitamins and trace elements on MWF only due to national shortage  Reduce IVF to 20 ml/hr when TPN started at 40 ml/hr to keep IVF 100 ml/hr or per  CCS orders  continue sSSI q6h until TPN at goal rate  TPN lab panels on Mondays & Thursdays. BMET, Mg, Phos in AM  F/u daily.   Eudelia Bunch, Pharm.D 01/03/2019 7:15 AM

## 2019-01-03 NOTE — Progress Notes (Signed)
PROGRESS NOTE    Danielle Griffin  GEZ:[id number] DOB: Nov 12, 1960 DOA: 12/25/2018 PCP: [facility]   Brief Narrative:  Is a 58 year old African-American obese female with a past medical history significant for but not limited to essential hypertension, anemia, history of cystitis, history of leukopenia, menorrhagia, history of allergic rhinitis, and other comorbidities who presented with a chief complaint of abdominal pain.  Further work-up showed that she had a small bowel obstruction and NG tube was placed.  Because she failed conservative management and medical therapy is she was taken to the OR for an exploratory laparotomy lysis of adhesions on 12/30/2018.  She is postoperative day 2 and no longer complaining of Abdominal Pain but has not passed gass or had a bowel movement. Foley has been removed and her R Midline was occluded and line pulled back yesterday.  Per general surgery since bowel function did not return today she was started on TPN for prolonged ileus and General Surgery adding Reglan today .   Assessment & Plan:   Principal Problem:   Abdominal pain Active Problems:   Essential hypertension   Hypokalemia   AKI (acute kidney injury) (HCC)   Normocytic anemia   Hyperglycemia   Obesity (BMI 30-39.9)  Small Bowel Obstruction status post exploratory laparotomy and lysis of adhesions by Dr. Ninfa Linden on 01/29/353 now complicated with Prolonged Ileus  -Has had multiple risk factors for small bowel obstruction given her extensive surgical history of tubal ligation and robotic assisted laparoscopic vaginal hysterectomy -CT Abdomen and Pelvis w/o Contrast showed that there were numerous loops of distended small bowel in the mid abdomen with the largest loops measuring up to 4 cm.  The distal small bowel was decompressed.  Findings were consistent with small bowel obstruction and then transition point was difficult to identify although suspected to be in the vicinity  of the left adnexa -NG tube placed and continuing for now until Bowel  -Failed clamp trial previously and had Minimal BM with suppository so was taken to Surgery for Intervention  -NG tube came out 12/28/2018 accidentally and has now been Replaced and is still continuing  -Unfortunately failed conservative therapies and medical management and was taken for surgical intervention with a exploratory laparotomy and lysis of adhesions she is postoperative Day 4 -General surgery recommendations:  Await return of bowel function, ambulate, incentive spirometry, pain control as well as continuing NGT until return of bowel function as likely she will have a postop ileus which is now prolonged; Patient is to start TPN today given Prolonged Ileus  -Currently pain is controlled with hydrocodone-acetaminophen 1 tab (7.5-325 mg) p.o. every 4 as needed moderate pain; IV morphine PCA with naloxone now discontinued and she is on IV Morphine 2-4 mg q2hprn -She has a Bowel Regimen with Bisacodyl 10 mg RC q12hprn Mild to Moderate Constipation and  -Supportive care with antiemetics with p.o./IV Zofran and Compazine -Continue with Robaxin thousand milligrams IV every 6 hours PRN for muscle spasms -Was getting D5 half-normal saline now rate reduced to 20 mL/hr  -Diet advancement per general surgery once bowel function returns but is currently n.p.o. except meds and sips and started on TPN and getting it continuous at 80 mL/hr  -Surgery adding IV Metaclopramide 5 mg IV q8hprn Nausea   Acute Kidney Injury, improved  -Unknown baseline. Creatinine of 2.4 on admission.  -Trending down with IV fluids and BUN/Cr is now 11/1.01 -D5 1/2 NS to be changed to 20 mL/hr today  -Avoid nephrotoxic  medications, contrast dyes as well as hypotension -Continue to monitor and trend renal function and repeat CMP in the a.m.  Essential Hypertension -On Hydrochlorothiazide and Olmesartan as an outpatient.  -Held secondary to AKI and  dehydration -C/w IVF hydration as above -Continue Hydralazine 10 mg IV q4hprn -BP was now 156/85  Hypokalemia -Patient's potassium went from 3.2 -Will be Replete in TPN and recived IV KCl 40 mEQ yesterday and 40 mEQ this AM  -Continue monitor and replete as necessary -Repeat CMP in a.m.  Normocytic Anemia -Patient's potassium went from 12.7/40.5 -> 11.9/38.8 -> 10.0/32.4 -> 9.5/31.3 -> 9.6/30.7 -Likely in setting of dilutional drop and postsurgical drop -Checked Anemia Panel showed iron level of 29, U IBC 191, TIBC of 220, saturation ratios of 13%, ferritin level 484, folate level 14.1, and vitamin B12 level of 608 -Continue to monitor for signs and symptoms of bleeding carefully -Continue monitor and trend CBC  Hyperglycemia -In the setting of D5 IVF -Patient's glucose on BMP is been ranging from 104-147  -CBG's ranging from 91-132 -Checked HbA1c and was 5.0 -Continue monitor and trend blood sugars and if remain consistently elevated will place on sensitive NovoLog/scale insulin AC  Obesity -Estimated body mass index is 38.48 kg/m as calculated from the following:   Height as of this encounter: 5\' 5"  (1.651 m).   Weight as of this encounter: 104.9 kg. -Weight Loss and Dietary Counseling given  -Dietician is on board  Abnormal LFTs and Hyperbilirubinemia -Patient's AST went from 28 -> 40 -> 64 -Patient's ALT went from 35 -> 35 -> 45 -T Bili Went from 0.8 -> 0.9 -> 1.3 -In the setting of TPN -Check RUQ U/S and Acute Hepatitis Panel -Continue to Monitor and Trend Hepatic Function -Repeat CMP in AM  DVT prophylaxis: Enoxaparin 40 mg sq q24h Code Status: FULL CODE Family Communication: No family present at bedside  Disposition Plan: Pending Tolerance of Diet and General Surgery Clearance; Now Getting TPN today given prolonged ileus   Consultants:   General Surgery   Procedures:  NGT (4/30 -> Current) -PICC Placement with TPN started on 01/02/2019   Antimicrobials:   Anti-infectives (From admission, onward)   Start     Dose/Rate Route Frequency Ordered Stop   12/30/18 1200  ciprofloxacin (CIPRO) IVPB 400 mg    Note to Pharmacy:  preop For surgery today   400 mg 200 mL/hr over 60 Minutes Intravenous  Once 12/30/18 0826 12/30/18 1530     Subjective: Seen and examined at bedside was little frustrated.  States she has been ambulating but still no return of bowel function still normal passing of flatus.  Talk with general surgery this morning and they said they are can add a medication to help this is regular.  No chest pain, lightheadedness or dizziness.  No abdominal pain.  No other concerns complaints at this time.  Objective: Vitals:   01/02/19 1341 01/02/19 2103 01/03/19 0504 01/03/19 0504  BP: (!) 146/87 (!) 145/85  (!) 156/85  Pulse: 89 85  83  Resp: 17 18  16   Temp: 98.6 F (37 C) (!) 97.5 F (36.4 C)  99.1 F (37.3 C)  TempSrc: Oral Oral  Oral  SpO2: 95% 100%  97%  Weight:   104.9 kg   Height:        Intake/Output Summary (Last 24 hours) at 01/03/2019 1124 Last data filed at 01/03/2019 0952 Gross per 24 hour  Intake 1943.21 ml  Output 1950 ml  Net -6.79 ml  Filed Weights   01/01/19 0505 01/02/19 0500 01/03/19 0504  Weight: 103.7 kg 101.8 kg 104.9 kg   Examination: Physical Exam:  Constitutional: Well-nourished, well-developed obese African-American female currently in no acute distress appears calm but slightly frustrated. Eyes: Sclera anicteric.  Lids and conjunctive are normal. ENMT: External ears and nose appear normal.  Grossly normal hearing.  Has NG tube in place Neck: Appears supple no JVD Respiratory: Mildly diminished auscultation bilaterally no appreciable wheezing, rales, rhonchi.  Patient not tachypneic using accessory muscles breathe Cardiovascular: Regular rate and rhythm.  No appreciable murmurs, rubs, gallops.  Minimal lower extremity edema Abdomen: Soft, nontender, distended secondary body habitus.  Has a  honeycomb dressing on abdomen.  Bowel sounds are severely diminished GU: Deferred Musculoskeletal: No contractures or cyanosis.  No joint deformities in the upper or lower extremities Skin: Skin is warm dry no appreciable rashes or lesions on limited skin evaluation but does have a honeycomb dressing on her abdomen that did appear clean dry and intact Neurologic: Cranial nerves II through XII grossly intact no appreciable focal deficits.  Romberg sign and cerebellar reflexes were not assessed Psychiatric: Has a frustrated mood and affect.  Intact judgment insight.  She is awake, alert, oriented x3  Data Reviewed: I have personally reviewed following labs and imaging studies  CBC: Recent Labs  Lab 12/29/18 0349 12/31/18 0408 01/01/19 0458 01/02/19 0453 01/03/19 0439  WBC 7.3 8.7 7.5 6.4 6.8  NEUTROABS  --   --  4.9 4.4 5.1  HGB 12.7 11.9* 10.0* 9.5* 9.6*  HCT 40.5 38.8 32.4* 31.3* 30.7*  MCV 95.7 99.0 99.4 97.8 96.5  PLT 257 235 217 234 867   Basic Metabolic Panel: Recent Labs  Lab 12/30/18 1724 12/31/18 0408 01/01/19 0458 01/02/19 0453 01/03/19 0439  NA 138 139 139 139 139  K 4.4 4.6 3.7 3.3* 3.2*  CL 102 104 104 105 100  CO2 27 27 29 29 29   GLUCOSE 119* 130* 108* 104* 126*  BUN 18 17 15 11 12   CREATININE 1.25* 1.07* 1.08* 1.01* 1.03*  CALCIUM 8.4* 8.7* 8.6* 8.7* 9.0  MG  --   --  2.3 2.1 2.0  PHOS  --   --  3.1 3.3 3.3   GFR: Estimated Creatinine Clearance: 70.7 mL/min (A) (by C-G formula based on SCr of 1.03 mg/dL (H)). Liver Function Tests: Recent Labs  Lab 01/01/19 0458 01/02/19 0453 01/03/19 0439  AST 28 40 64*  ALT 35 35 45*  ALKPHOS 79 109 170*  BILITOT 0.8 0.9 1.3*  PROT 6.0* 5.8* 5.8*  ALBUMIN 3.3* 3.1* 3.0*   No results for input(s): LIPASE, AMYLASE in the last 168 hours. No results for input(s): AMMONIA in the last 168 hours. Coagulation Profile: No results for input(s): INR, PROTIME in the last 168 hours. Cardiac Enzymes: No results for  input(s): CKTOTAL, CKMB, CKMBINDEX, TROPONINI in the last 168 hours. BNP (last 3 results) No results for input(s): PROBNP in the last 8760 hours. HbA1C: Recent Labs    01/01/19 0458  HGBA1C 5.0   CBG: Recent Labs  Lab 01/01/19 0729 01/02/19 0801 01/02/19 1816 01/02/19 2330 01/03/19 0505  GLUCAP 78 104* 91 132* 116*   Lipid Profile: Recent Labs    01/03/19 0439  TRIG 190*   Thyroid Function Tests: No results for input(s): TSH, T4TOTAL, FREET4, T3FREE, THYROIDAB in the last 72 hours. Anemia Panel: Recent Labs    01/01/19 0458  VITAMINB12 608  FOLATE 14.1  FERRITIN 484*  TIBC 220*  IRON 29  RETICCTPCT 2.8   Sepsis Labs: No results for input(s): PROCALCITON, LATICACIDVEN in the last 168 hours.  Recent Results (from the past 240 hour(s))  MRSA PCR Screening     Status: None   Collection Time: 12/30/18  8:56 AM  Result Value Ref Range Status   MRSA by PCR NEGATIVE NEGATIVE Final    Comment:        The GeneXpert MRSA Assay (FDA approved for NASAL specimens only), is one component of a comprehensive MRSA colonization surveillance program. It is not intended to diagnose MRSA infection nor to guide or monitor treatment for MRSA infections. Performed at [facility], [place] [address], [place], [place] [address]      Radiology Studies: [place] Ekg Site Rite  Result Date: 01/02/2019 If [facility] image not attached, placement could not be confirmed due to current cardiac rhythm.  US Abdomen Limited Ruq  Result Date: 01/03/2019 CLINICAL DATA:  Abnormal LFTs. EXAM: ULTRASOUND ABDOMEN LIMITED RIGHT UPPER QUADRANT COMPARISON:  None. FINDINGS: Gallbladder: No gallstones or wall thickening visualized. No sonographic Murphy sign noted by sonographer. Common bile duct: Diameter: Normal at 3.4 cm Liver: No focal lesion identified. Within normal limits in parenchymal echogenicity. Portal vein is patent on color Doppler imaging with normal direction of blood flow  towards the liver. IMPRESSION: No acute findings in the RIGHT upper quadrant ultrasound. Electronically Signed   By: Suzy Bouchard M.D.   On: 01/03/2019 09:49   Scheduled Meds: . enoxaparin (LOVENOX) injection  40 mg Subcutaneous Q24H  . insulin aspart  0-9 Units Subcutaneous Q6H  . lip balm  1 application Topical BID  . sodium chloride flush  10-40 mL Intracatheter Q12H  . sodium chloride flush  10-40 mL Intracatheter Q12H   Continuous Infusions: . dextrose 5 % and 0.45% NaCl 60 mL/hr at 01/03/19 0940  . dextrose 5 % and 0.45% NaCl    . methocarbamol (ROBAXIN) IV Stopped (12/28/18 1510)  . ondansetron (ZOFRAN) IV    . potassium chloride 10 mEq (01/03/19 1058)  . TPN ADULT (ION) 40 mL/hr at 01/03/19 0523  . TPN ADULT (ION)      LOS: 8 days   Kerney Elbe, DO [facility] PAGER is on AMION  If 7PM-7AM, please contact night-coverage [email or web address] Password [email or web address] 01/03/2019, 11:24 AM

## 2019-01-04 DIAGNOSIS — R3989 Other symptoms and signs involving the genitourinary system: Secondary | ICD-10-CM

## 2019-01-04 LAB — URINALYSIS, ROUTINE W REFLEX MICROSCOPIC
Bilirubin Urine: NEGATIVE
Glucose, UA: NEGATIVE mg/dL
Hgb urine dipstick: NEGATIVE
Ketones, ur: NEGATIVE mg/dL
Nitrite: NEGATIVE
Protein, ur: NEGATIVE mg/dL
Specific Gravity, Urine: 1.012 (ref 1.005–1.030)
WBC, UA: >50 WBC/hpf — ABNORMAL HIGH (ref 0–5)
pH: 9 — ABNORMAL HIGH (ref 5.0–8.0)

## 2019-01-04 LAB — COMPREHENSIVE METABOLIC PANEL
ALT: 51 U/L — ABNORMAL HIGH (ref 0–44)
AST: 62 U/L — ABNORMAL HIGH (ref 15–41)
Albumin: 3.1 g/dL — ABNORMAL LOW (ref 3.5–5.0)
Alkaline Phosphatase: 176 U/L — ABNORMAL HIGH (ref 38–126)
Anion gap: 9 (ref 5–15)
BUN: 16 mg/dL (ref 6–20)
CO2: 27 mmol/L (ref 22–32)
Calcium: 9 mg/dL (ref 8.9–10.3)
Chloride: 102 mmol/L (ref 98–111)
Creatinine, Ser: 1.04 mg/dL — ABNORMAL HIGH (ref 0.44–1.00)
GFR calc Af Amer: >60 mL/min (ref >60)
GFR calc non Af Amer: 59 mL/min — ABNORMAL LOW (ref >60)
Glucose, Bld: 132 mg/dL — ABNORMAL HIGH (ref 70–99)
Potassium: 3.9 mmol/L (ref 3.5–5.1)
Sodium: 138 mmol/L (ref 135–145)
Total Bilirubin: 0.8 mg/dL (ref 0.3–1.2)
Total Protein: 6.1 g/dL — ABNORMAL LOW (ref 6.5–8.1)

## 2019-01-04 LAB — CBC WITH DIFFERENTIAL/PLATELET
Abs Immature Granulocytes: 0.27 10*3/uL — ABNORMAL HIGH (ref 0.00–0.07)
Basophils Absolute: 0 10*3/uL (ref 0.0–0.1)
Basophils Relative: 0 %
Eosinophils Absolute: 0.3 10*3/uL (ref 0.0–0.5)
Eosinophils Relative: 3 %
HCT: 30.8 % — ABNORMAL LOW (ref 36.0–46.0)
Hemoglobin: 9.8 g/dL — ABNORMAL LOW (ref 12.0–15.0)
Immature Granulocytes: 3 %
Lymphocytes Relative: 10 %
Lymphs Abs: 1 10*3/uL (ref 0.7–4.0)
MCH: 30.6 pg (ref 26.0–34.0)
MCHC: 31.8 g/dL (ref 30.0–36.0)
MCV: 96.3 fL (ref 80.0–100.0)
Monocytes Absolute: 0.7 10*3/uL (ref 0.1–1.0)
Monocytes Relative: 7 %
Neutro Abs: 7.4 10*3/uL (ref 1.7–7.7)
Neutrophils Relative %: 77 %
Platelets: 246 10*3/uL (ref 150–400)
RBC: 3.2 MIL/uL — ABNORMAL LOW (ref 3.87–5.11)
RDW: 13.2 % (ref 11.5–15.5)
WBC: 9.6 10*3/uL (ref 4.0–10.5)
nRBC: 0 % (ref 0.0–0.2)

## 2019-01-04 LAB — MAGNESIUM: Magnesium: 2 mg/dL (ref 1.7–2.4)

## 2019-01-04 LAB — GLUCOSE, CAPILLARY: Glucose-Capillary: 123 mg/dL — ABNORMAL HIGH (ref 70–99)

## 2019-01-04 LAB — PHOSPHORUS: Phosphorus: 3.6 mg/dL (ref 2.5–4.6)

## 2019-01-04 MED ORDER — TRAVASOL 10 % IV SOLN
INTRAVENOUS | Status: AC
  Administered 2019-01-04: 17:00:00 via INTRAVENOUS
  Filled 2019-01-04: qty 806.4

## 2019-01-04 MED ORDER — SODIUM CHLORIDE 0.9 % IV SOLN
1.0000 g | INTRAVENOUS | Status: DC
  Administered 2019-01-04 – 2019-01-05 (×2): 1 g via INTRAVENOUS
  Filled 2019-01-04: qty 1
  Filled 2019-01-04: qty 10
  Filled 2019-01-04: qty 1

## 2019-01-04 NOTE — Plan of Care (Signed)
  Problem: Clinical Measurements: Goal: Will remain free from infection Outcome: Progressing   Problem: Clinical Measurements: Goal: Diagnostic test results will improve Outcome: Progressing   Problem: Clinical Measurements: Goal: Respiratory complications will improve Outcome: Progressing   Problem: Clinical Measurements: Goal: Cardiovascular complication will be avoided Outcome: Progressing   Problem: Nutrition: Goal: Adequate nutrition will be maintained Outcome: Progressing   Problem: Safety: Goal: Ability to remain free from injury will improve Outcome: Progressing

## 2019-01-04 NOTE — Progress Notes (Signed)
[facility] NOTE   Pharmacy Consult for TPN Indication: prolonged ileus  Patient Measurements: Height: _0  (165.1 cm) Weight: 218 lb 11.1 oz (99.2 kg) IBW/kg (Calculated) : 57 TPN AdjBW (KG): 70 Body mass index is 36.39 kg/m. Usual Weight:   Insulin Requirements: 2 U SSI  Current Nutrition: NPO, TPN at 80 ml/hr  IVF: [id number] 20 ml/hr   Central access: PICC placed 5/8 for TPN TPN start date: 5/8  ASSESSMENT                                                                                                          HPI: Surg hx =tubal ligation,&robotic assisted laparoscopic vaginal hysterectomy  -S/P ex lap, LOA, Dr. Ninfa Linden, 05/05  Significant events:   Today: Had a large bm and flatus overnight   Glucose: no hx DM, 2 U SSI, CBGS < 150 on TPN at 80 ml/hr  Electrolytes: K 3.9 after- 4 runs yesterday (goal >=4) phos 3.6 mag 2.0 (goal >=2)  Renal:AKI resolving   LFTs: AST/ALT, Alk phos slightly elevated, T bili 1.3> 0.8  TGs: 190 5/9  Prealbumin: 12 5/9   NUTRITIONAL GOALS                                                                                             RD recs: 01/02/19  Kcal:  1700-1900 Protein:  70-80g Fluid:  1.9L/day  Custom TPN at goal rate of 80 ml/hr provides: -  80.64 g/day protein (42 g/L) -   48 g/day Lipid  (25 g/L) -  288 g/day Dextrose ( 15 %) -  1782 Kcal/day  PLAN                                                                                                                    At 1800 today:  continue TPN to 80 ml/hr  - goal rate of 80 ml/hr provides 80.6 g of protein, 288 g of dextrose, and 48 g of lipids which provides 1782 kCals per day, meeting 100 % of goal kcal and goal AA  Electrolytes in TPN: Standard x K 60, Cl:Ac ratio 1:1  TPN to contain standard multivitamins  and trace elements on MWF only due to national shortage  IVF 20 ml/hr or per [facility] orders  DC SSI and DC CBGs  TPN  lab panels on Mondays & Thursdays. BMET  Per [facility] continue TPN until tomorrow then d/c if she tolerates diet   Eudelia Bunch, Pharm.D 01/04/2019 8:12 AM

## 2019-01-04 NOTE — Progress Notes (Signed)
PROGRESS NOTE    Danielle Griffin  BOF:[id number] DOB: [birth year] DOA: 12/25/2018 PCP: [facility]   Brief Narrative:  Is a 58 year old African-American obese female with a past medical history significant for but not limited to essential hypertension, anemia, history of cystitis, history of leukopenia, menorrhagia, history of allergic rhinitis, and other comorbidities who presented with a chief complaint of abdominal pain.  Further work-up showed that she had a small bowel obstruction and NG tube was placed.  Because she failed conservative management and medical therapy is she was taken to the OR for an exploratory laparotomy lysis of adhesions on 12/30/2018.  She is postoperative day 5 and no longer complaining of Abdominal Pain but had not passed gas or had a bowel movement until last night when she had a large BM and flatus overnight. Foley has been removed and her R Midline was occluded but now improved.    Since bowel function did not return the day before yesterday she was started on TPN for prolonged ileus and General Surgery adding Reglan prn yesterday.  Because she had a bowel movement now Dr. Marlou Starks recommending continuing TPN until tomorrow and then discontinue if she tolerates diet. She is now on a CLD and D/C'd NGT.  Session was complicated by her complaining about urinary urgency so we will check for UTI and start empiric antibiotics  Assessment & Plan:   Principal Problem:   Abdominal pain Active Problems:   Essential hypertension   Hypokalemia   AKI (acute kidney injury) ([place])   Normocytic anemia   Hyperglycemia   Obesity (BMI 30-39.9)  Small Bowel Obstruction status post exploratory laparotomy and lysis of adhesions by Dr. Ninfa Linden on 03/02/8241 now complicated with Prolonged Ileus, improving  -Has had multiple risk factors for small bowel obstruction given her extensive surgical history of tubal ligation and robotic assisted laparoscopic vaginal  hysterectomy -CT Abdomen and Pelvis w/o Contrast showed that there were numerous loops of distended small bowel in the mid abdomen with the largest loops measuring up to 4 cm.  The distal small bowel was decompressed.  Findings were consistent with small bowel obstruction and then transition point was difficult to identify although suspected to be in the vicinity of the left adnexa -NG tube placed and this is now been discontinued as she is having a bowel movement -Failed clamp trial previously and had Minimal BM with suppository so was taken to Surgery for Intervention  -Unfortunately failed conservative therapies and medical management and was taken for surgical intervention with a exploratory laparotomy and lysis of adhesions she is postoperative Day 5 -General surgery recommendations:  Await return of bowel function, ambulate, incentive spirometry, pain control as well; Patient was started on TPN yesterday given Prolonged Ileus but this is improving and NG tube has been removed and Dr. Joseph Pierini recommending stopping TPN tomorrow if she tolerates a diet -Currently pain is controlled with hydrocodone-acetaminophen 1 tab (7.5-325 mg) p.o. every 4 as needed moderate pain; IV morphine PCA with naloxone now discontinued and she is on IV Morphine 2-4 mg q2hprn -She has a Bowel Regimen with Bisacodyl 10 mg RC q12hprn Mild to Moderate Constipation and  -Supportive care with antiemetics with p.o./IV Zofran and Compazine -Continue with Robaxin thousand milligrams IV every 6 hours PRN for muscle spasms -Was getting D5 half-normal saline now rate reduced to 20 mL/hr  -Diet advancement per general surgery once bowel function returns but is currently n.p.o. except meds and sips and started on TPN and  getting it continuous at 80 mL/hr  -Surgery adding IV Metaclopramide 5 mg IV q8hprn Nausea  -Now on a CLD  Acute Kidney Injury, improved  -Unknown baseline. Creatinine of 2.4 on admission.  -Trending down with IV  fluids and BUN/Cr is now 16/1.04 -D5 1/2 NS will continue at 20 mL/hr today  -Avoid nephrotoxic medications, contrast dyes as well as hypotension -Continue to monitor and trend renal function and repeat CMP in the a.m.  Essential Hypertension -On Hydrochlorothiazide and Olmesartan as an outpatient.  -Held secondary to AKI and dehydration -C/w IVF hydration as above -Continue Hydralazine 10 mg IV q4hprn -BP was now 150/84  Hypokalemia -Patient's potassium is now 3.9 -Continue monitor and replete as necessary -Repeat CMP in a.m.  Normocytic Anemia -Patient's potassium went from 12.7/40.5 -> 11.9/38.8 -> 10.0/32.4 -> 9.5/31.3 -> 9.6/30.7 -> 9.8/30.8 -Likely in setting of dilutional drop and postsurgical drop -Checked Anemia Panel showed iron level of 29, U IBC 191, TIBC of 220, saturation ratios of 13%, ferritin level 484, folate level 14.1, and vitamin B12 level of 608 -Continue to monitor for signs and symptoms of bleeding carefully -Continue monitor and trend CBC  Hyperglycemia -In the setting of D5 IVF -Patient's glucose on BMP is been ranging from 104-147; BS this AM was 132 -CBG's ranging from 91-132 -Checked HbA1c and was 5.0 -Continue monitor and trend blood sugars and if remain consistently elevated will place on sensitive NovoLog/scale insulin AC  Obesity -Estimated body mass index is 36.39 kg/m as calculated from the following:   Height as of this encounter: 5\' 5"  (1.651 m).   Weight as of this encounter: 99.2 kg. -Weight Loss and Dietary Counseling given  -Dietician is on board  Abnormal LFTs and Hyperbilirubinemia -Patient's AST went from 28 -> 40 -> 64 -> 62 -Patient's ALT went from 35 -> 35 -> 45 -> 51 -T Bili Went from 0.8 -> 0.9 -> 1.3 and is now improved at 0.8 -In the setting of TPN -Check RUQ U/S and Acute Hepatitis Panel -RUQ U/S showed "No acute findings in the RIGHT upper quadrant ultrasound." -Acute Hepatitis Panel pending  -Continue to Monitor  and Trend Hepatic Function -Repeat CMP in AM  Urinary Urgency and Frequency with Concern for UTI -Urinalysis showed Hazy Appearance, Small Leukocytes, Rare Bacteria and >50 WBC; Negative Nitrites  -Urinalysis Cx pending -Will Empirically Start IV Ceftriaxone for at least 3 Days -Continue to Monitor closely   DVT prophylaxis: Enoxaparin 40 mg sq q24h Code Status: FULL CODE Family Communication: No family present at bedside  Disposition Plan: Pending Tolerance of Diet and General Surgery Clearance; Now Getting TPN today given prolonged ileus   Consultants:   General Surgery   Procedures:  NGT (4/30 -> Current) -PICC Placement with TPN started on 01/02/2019   Antimicrobials:  Anti-infectives (From admission, onward)   Start     Dose/Rate Route Frequency Ordered Stop   12/30/18 1200  ciprofloxacin (CIPRO) IVPB 400 mg    Note to Pharmacy:  preop For surgery today   400 mg 200 mL/hr over 60 Minutes Intravenous  Once 12/30/18 0826 12/30/18 1530     Subjective: Seen and examined at bedside was doing better today and states that she had passed some flatus and had a large bowel movement overnight.  Denies any chest pain, lightheadedness or dizziness.  Did have some urinary urgency and frequency.  No other concerns or complaints at this time and is happy that her NG tube is out and that she is  tolerating clear liquid diet.  Objective: Vitals:   01/03/19 1309 01/03/19 2205 01/04/19 0500 01/04/19 0513  BP: (!) 149/91 (!) 168/96  (!) 150/84  Pulse: 89 98  95  Resp: 17 16  20   Temp: 98.8 F (37.1 C) 99 F (37.2 C)  98.6 F (37 C)  TempSrc: Oral Oral  Oral  SpO2: 99% 97%  98%  Weight:   99.2 kg   Height:        Intake/Output Summary (Last 24 hours) at 01/04/2019 1253 Last data filed at 01/04/2019 1010 Gross per 24 hour  Intake 2020 ml  Output 850 ml  Net 1170 ml   Filed Weights   01/02/19 0500 01/03/19 0504 01/04/19 0500  Weight: 101.8 kg 104.9 kg 99.2 kg   Examination:  Physical Exam:  Constitutional: Well-nourished, well-developed obese African-American female currently no acute distress appears calm and more comfortable and is tolerating clear liquid diet Eyes: Sclera anicteric.  Lids extract are normal ENMT: External ears and nose appear normal.  Grossly normal hearing.  NG tube is been removed Neck: Appears supple no JVD Respiratory: Diminished auscultation bilaterally no appreciable wheezing, rales, rhonchi.  Patient was not tachypneic wheezing excess muscle breathe Cardiovascular: Regular rate and rhythm.  No appreciable murmurs, rubs, gallops.  No lower extremity edema Abdomen: Soft, nontender, distended secondary to body habitus.  Bowel sounds are improving GU: Deferred Musculoskeletal: No contractures or cyanosis.  No joint fomites in the upper lower extremities. Skin: Skin is warm dry no appreciable rashes or lesions on limited skin evaluation. Neurologic: Cranial nerves II through XII grossly intact no appreciable focal deficits.  Romberg sign cerebellar reflexes were not assessed. Psychiatric: Pleasant mood and affect.  Intact judgment insight.  She is awake, alert, oriented x3  Data Reviewed: I have personally reviewed following labs and imaging studies  CBC: Recent Labs  Lab 12/31/18 0408 01/01/19 0458 01/02/19 0453 01/03/19 0439 01/04/19 0442  WBC 8.7 7.5 6.4 6.8 9.6  NEUTROABS  --  4.9 4.4 5.1 7.4  HGB 11.9* 10.0* 9.5* 9.6* 9.8*  HCT 38.8 32.4* 31.3* 30.7* 30.8*  MCV 99.0 99.4 97.8 96.5 96.3  PLT 235 217 234 231 409   Basic Metabolic Panel: Recent Labs  Lab 12/31/18 0408 01/01/19 0458 01/02/19 0453 01/03/19 0439 01/04/19 0442  NA 139 139 139 139 138  K 4.6 3.7 3.3* 3.2* 3.9  CL 104 104 105 100 102  CO2 27 29 29 29 27   GLUCOSE 130* 108* 104* 126* 132*  BUN 17 15 11 12 16   CREATININE 1.07* 1.08* 1.01* 1.03* 1.04*  CALCIUM 8.7* 8.6* 8.7* 9.0 9.0  MG  --  2.3 2.1 2.0 2.0  PHOS  --  3.1 3.3 3.3 3.6   GFR: Estimated  Creatinine Clearance: 67.9 mL/min (A) (by C-G formula based on SCr of 1.04 mg/dL (H)). Liver Function Tests: Recent Labs  Lab 01/01/19 0458 01/02/19 0453 01/03/19 0439 01/04/19 0442  AST 28 40 64* 62*  ALT 35 35 45* 51*  ALKPHOS 79 109 170* 176*  BILITOT 0.8 0.9 1.3* 0.8  PROT 6.0* 5.8* 5.8* 6.1*  ALBUMIN 3.3* 3.1* 3.0* 3.1*   No results for input(s): LIPASE, AMYLASE in the last 168 hours. No results for input(s): AMMONIA in the last 168 hours. Coagulation Profile: No results for input(s): INR, PROTIME in the last 168 hours. Cardiac Enzymes: No results for input(s): CKTOTAL, CKMB, CKMBINDEX, TROPONINI in the last 168 hours. BNP (last 3 results) No results for input(s): PROBNP in the  last 8760 hours. HbA1C: No results for input(s): HGBA1C in the last 72 hours. CBG: Recent Labs  Lab 01/03/19 0505 01/03/19 1129 01/03/19 1806 01/03/19 2349 01/04/19 0514  GLUCAP 116* 105* 86 148* 123*   Lipid Profile: Recent Labs    01/03/19 0439  TRIG 190*   Thyroid Function Tests: No results for input(s): TSH, T4TOTAL, FREET4, T3FREE, THYROIDAB in the last 72 hours. Anemia Panel: No results for input(s): VITAMINB12, FOLATE, FERRITIN, TIBC, IRON, RETICCTPCT in the last 72 hours. Sepsis Labs: No results for input(s): PROCALCITON, LATICACIDVEN in the last 168 hours.  Recent Results (from the past 240 hour(s))  MRSA PCR Screening     Status: None   Collection Time: 12/30/18  8:56 AM  Result Value Ref Range Status   MRSA by PCR NEGATIVE NEGATIVE Final    Comment:        The GeneXpert MRSA Assay (FDA approved for NASAL specimens only), is one component of a comprehensive MRSA colonization surveillance program. It is not intended to diagnose MRSA infection nor to guide or monitor treatment for MRSA infections. Performed at [facility], [place] [address] [place], [place] [address]      Radiology Studies: US Abdomen Limited Ruq  Result Date: 01/03/2019  CLINICAL DATA:  Abnormal LFTs. EXAM: ULTRASOUND ABDOMEN LIMITED RIGHT UPPER QUADRANT COMPARISON:  None. FINDINGS: Gallbladder: No gallstones or wall thickening visualized. No sonographic Murphy sign noted by sonographer. Common bile duct: Diameter: Normal at 3.4 cm Liver: No focal lesion identified. Within normal limits in parenchymal echogenicity. Portal vein is patent on color Doppler imaging with normal direction of blood flow towards the liver. IMPRESSION: No acute findings in the RIGHT upper quadrant ultrasound. Electronically Signed   By: Suzy Bouchard M.D.   On: 01/03/2019 09:49   Scheduled Meds: . enoxaparin (LOVENOX) injection  40 mg Subcutaneous Q24H  . lip balm  1 application Topical BID  . sodium chloride flush  10-40 mL Intracatheter Q12H  . sodium chloride flush  10-40 mL Intracatheter Q12H   Continuous Infusions: . dextrose 5 % and 0.45% NaCl    . methocarbamol (ROBAXIN) IV Stopped (12/28/18 1510)  . ondansetron (ZOFRAN) IV    . TPN ADULT (ION) 80 mL/hr at 01/04/19 1010  . TPN ADULT (ION)      LOS: 9 days   Kerney Elbe, DO [facility] PAGER is on AMION  If 7PM-7AM, please contact night-coverage [email or web address] Password [facility] 01/04/2019, 12:53 PM

## 2019-01-04 NOTE — Progress Notes (Signed)
Pt stable overall tolerating clear liquid diet wells. No changes to note overall. Pt denies pain at this time and has walked several times in the hall. Pt has also had several good bowel movements today as well.

## 2019-01-04 NOTE — Progress Notes (Signed)
5 Days Post-Op   Subjective/Chief Complaint: No complaints. Had a large bm and flatus overnight   Objective: Vital signs in last 24 hours: Temp:  [98.6 F (37 C)-99 F (37.2 C)] 98.6 F (37 C) (05/10 0513) Pulse Rate:  [89-98] 95 (05/10 0513) Resp:  [16-20] 20 (05/10 0513) BP: (149-168)/(84-96) 150/84 (05/10 0513) SpO2:  [97 %-99 %] 98 % (05/10 0513) Weight:  [99.2 kg] 99.2 kg (05/10 0500) Last BM Date: 01/03/19  Intake/Output from previous day: 05/09 0701 - 05/10 0700 In: 1320 [P.O.:120; I.V.:1200] Out: 850 [Urine:200; Emesis/NG output:650] Intake/Output this shift: No intake/output data recorded.  General appearance: alert and cooperative Resp: clear to auscultation bilaterally Cardio: regular rate and rhythm GI: soft, mild tenderness. good bs. incision looks good  Lab Results:  Recent Labs    01/03/19 0439 01/04/19 0442  WBC 6.8 9.6  HGB 9.6* 9.8*  HCT 30.7* 30.8*  PLT 231 246   BMET Recent Labs    01/03/19 0439 01/04/19 0442  NA 139 138  K 3.2* 3.9  CL 100 102  CO2 29 27  GLUCOSE 126* 132*  BUN 12 16  CREATININE 1.03* 1.04*  CALCIUM 9.0 9.0   PT/INR No results for input(s): LABPROT, INR in the last 72 hours. ABG No results for input(s): PHART, HCO3 in the last 72 hours.  Invalid input(s): PCO2, PO2  Studies/Results: [place] Ekg Site Rite  Result Date: 01/02/2019 If Site Rite image not attached, placement could not be confirmed due to current cardiac rhythm.  US Abdomen Limited Ruq  Result Date: 01/03/2019 CLINICAL DATA:  Abnormal LFTs. EXAM: ULTRASOUND ABDOMEN LIMITED RIGHT UPPER QUADRANT COMPARISON:  None. FINDINGS: Gallbladder: No gallstones or wall thickening visualized. No sonographic Murphy sign noted by sonographer. Common bile duct: Diameter: Normal at 3.4 cm Liver: No focal lesion identified. Within normal limits in parenchymal echogenicity. Portal vein is patent on color Doppler imaging with normal direction of blood flow towards the liver.  IMPRESSION: No acute findings in the RIGHT upper quadrant ultrasound. Electronically Signed   By: Suzy Bouchard M.D.   On: 01/03/2019 09:49    Anti-infectives: Anti-infectives (From admission, onward)   Start     Dose/Rate Route Frequency Ordered Stop   12/30/18 1200  ciprofloxacin (CIPRO) IVPB 400 mg    Note to Pharmacy:  preop For surgery today   400 mg 200 mL/hr over 60 Minutes Intravenous  Once 12/30/18 0826 12/30/18 1530      Assessment/Plan: s/p Procedure(s): EXPLORATORY LAPAROTOMY, LYSIS OF ADHESIONS (N/A) Advance diet. Start clears today D/c ng Ambulate  Principal Problem: Abdominal pain Active Problems: Essential hypertension Hypokalemia AKI (acute kidney injury) ([place])  SBO -Surg hx =tubal ligation,&robotic assisted laparoscopic vaginal hysterectomy  -S/P ex lap, LOA, Dr. Ninfa Linden, 05/05 post op ileus - d/c ng - continue tpn until tomorrow then d/c if she tolerates diet  FEN: TPN VTE: SCD's, lovenox [id number] pre-op Foley:none Follow up:Dr. Ninfa Linden  LOS: 9 days    Danielle Griffin 01/04/2019

## 2019-01-05 LAB — CBC WITH DIFFERENTIAL/PLATELET
Abs Immature Granulocytes: 0.22 10*3/uL — ABNORMAL HIGH (ref 0.00–0.07)
Basophils Absolute: 0 10*3/uL (ref 0.0–0.1)
Basophils Relative: 0 %
Eosinophils Absolute: 0.4 10*3/uL (ref 0.0–0.5)
Eosinophils Relative: 3 %
HCT: 29.5 % — ABNORMAL LOW (ref 36.0–46.0)
Hemoglobin: 9.4 g/dL — ABNORMAL LOW (ref 12.0–15.0)
Immature Granulocytes: 2 %
Lymphocytes Relative: 9 %
Lymphs Abs: 1 10*3/uL (ref 0.7–4.0)
MCH: 30.7 pg (ref 26.0–34.0)
MCHC: 31.9 g/dL (ref 30.0–36.0)
MCV: 96.4 fL (ref 80.0–100.0)
Monocytes Absolute: 0.8 10*3/uL (ref 0.1–1.0)
Monocytes Relative: 7 %
Neutro Abs: 8.5 10*3/uL — ABNORMAL HIGH (ref 1.7–7.7)
Neutrophils Relative %: 79 %
Platelets: 243 10*3/uL (ref 150–400)
RBC: 3.06 MIL/uL — ABNORMAL LOW (ref 3.87–5.11)
RDW: 13.2 % (ref 11.5–15.5)
WBC: 10.9 10*3/uL — ABNORMAL HIGH (ref 4.0–10.5)
nRBC: 0 % (ref 0.0–0.2)

## 2019-01-05 LAB — COMPREHENSIVE METABOLIC PANEL
ALT: 56 U/L — ABNORMAL HIGH (ref 0–44)
ALT: 60 U/L — ABNORMAL HIGH (ref 0–44)
AST: 63 U/L — ABNORMAL HIGH (ref 15–41)
AST: 75 U/L — ABNORMAL HIGH (ref 15–41)
Albumin: 3.1 g/dL — ABNORMAL LOW (ref 3.5–5.0)
Albumin: 3.3 g/dL — ABNORMAL LOW (ref 3.5–5.0)
Alkaline Phosphatase: 208 U/L — ABNORMAL HIGH (ref 38–126)
Alkaline Phosphatase: 249 U/L — ABNORMAL HIGH (ref 38–126)
Anion gap: 9 (ref 5–15)
Anion gap: 11 (ref 5–15)
BUN: 16 mg/dL (ref 6–20)
BUN: 17 mg/dL (ref 6–20)
CO2: 25 mmol/L (ref 22–32)
CO2: 23 mmol/L (ref 22–32)
Calcium: 9.2 mg/dL (ref 8.9–10.3)
Calcium: 9.2 mg/dL (ref 8.9–10.3)
Chloride: 103 mmol/L (ref 98–111)
Chloride: 102 mmol/L (ref 98–111)
Creatinine, Ser: 1.01 mg/dL — ABNORMAL HIGH (ref 0.44–1.00)
Creatinine, Ser: 0.96 mg/dL (ref 0.44–1.00)
GFR calc Af Amer: >60 mL/min (ref >60)
GFR calc Af Amer: >60 mL/min (ref >60)
GFR calc non Af Amer: >60 mL/min (ref >60)
GFR calc non Af Amer: >60 mL/min (ref >60)
Glucose, Bld: 128 mg/dL — ABNORMAL HIGH (ref 70–99)
Glucose, Bld: 121 mg/dL — ABNORMAL HIGH (ref 70–99)
Potassium: 4.4 mmol/L (ref 3.5–5.1)
Potassium: 4.5 mmol/L (ref 3.5–5.1)
Sodium: 137 mmol/L (ref 135–145)
Sodium: 136 mmol/L (ref 135–145)
Total Bilirubin: 0.8 mg/dL (ref 0.3–1.2)
Total Bilirubin: 0.7 mg/dL (ref 0.3–1.2)
Total Protein: 6.2 g/dL — ABNORMAL LOW (ref 6.5–8.1)
Total Protein: 6.5 g/dL (ref 6.5–8.1)

## 2019-01-05 LAB — PREALBUMIN: Prealbumin: 14.2 mg/dL — ABNORMAL LOW (ref 18–38)

## 2019-01-05 LAB — HEPATITIS PANEL, ACUTE
HCV Ab: 0.1 s/co ratio (ref 0.0–0.9)
Hep A IgM: NEGATIVE
Hep B C IgM: NEGATIVE
Hepatitis B Surface Ag: NEGATIVE

## 2019-01-05 LAB — PHOSPHORUS: Phosphorus: 4.1 mg/dL (ref 2.5–4.6)

## 2019-01-05 LAB — TRIGLYCERIDES
Triglycerides: 232 mg/dL — ABNORMAL HIGH (ref <150)
Triglycerides: 247 mg/dL — ABNORMAL HIGH (ref <150)

## 2019-01-05 LAB — MAGNESIUM: Magnesium: 1.9 mg/dL (ref 1.7–2.4)

## 2019-01-05 MED ORDER — SODIUM CHLORIDE 0.45 % IV SOLN
INTRAVENOUS | Status: DC
  Administered 2019-01-05: 18:00:00 via INTRAVENOUS

## 2019-01-05 MED ORDER — TRAVASOL 10 % IV SOLN
INTRAVENOUS | Status: DC
  Administered 2019-01-05: 18:00:00 via INTRAVENOUS
  Filled 2019-01-05: qty 780

## 2019-01-05 NOTE — Discharge Instructions (Signed)
[facility]      [facility] °[phone] ° °OPEN ABDOMINAL SURGERY: POST OP INSTRUCTIONS ° °Always review your discharge instruction sheet given to you by the facility where your surgery was performed. ° °IF YOU HAVE DISABILITY OR FAMILY LEAVE FORMS, YOU MUST BRING THEM TO THE OFFICE FOR PROCESSING.  PLEASE DO NOT GIVE THEM TO YOUR DOCTOR. ° °1. A prescription for pain medication may be given to you upon discharge.  Take your pain medication as prescribed, if needed.  If narcotic pain medicine is not needed, then you may take acetaminophen (Tylenol) or ibuprofen (Advil) as needed. °2. Take your usually prescribed medications unless otherwise directed. °3. If you need a refill on your pain medication, please contact your pharmacy. They will contact our office to request authorization.  Prescriptions will not be filled after 5pm or on week-ends. °4. You should follow a light diet the first few days after arrival home, such as soup and crackers, pudding, etc.unless your doctor has advised otherwise. A high-fiber, low fat diet can be resumed as tolerated.   Be sure to include lots of fluids daily. Most patients will experience some swelling and bruising on the chest and neck area.  Ice packs will help.  Swelling and bruising can take several days to resolve °5. Most patients will experience some swelling and bruising in the area of the incision. Ice pack will help. Swelling and bruising can take several days to resolve..  °6. It is common to experience some constipation if taking pain medication after surgery.  Increasing fluid intake and taking a stool softener will usually help or prevent this problem from occurring.  A mild laxative (Milk of Magnesia or Miralax) should be taken according to package directions if there are no bowel movements after 48 hours. °7.  You may have steri-strips (small skin tapes) in place directly over the incision.  These strips should be left on the skin for 7-10 days.  If your  surgeon used skin glue on the incision, you may shower in 24 hours.  The glue will flake off over the next 2-3 weeks.  Any sutures or staples will be removed at the office during your follow-up visit. You may find that a light gauze bandage over your incision may keep your staples from being rubbed or pulled. You may shower and replace the bandage daily. °8. ACTIVITIES:  You may resume regular (light) daily activities beginning the next day--such as daily self-care, walking, climbing stairs--gradually increasing activities as tolerated.  You may have sexual intercourse when it is comfortable.  Refrain from any heavy lifting or straining until approved by your doctor. °a. You may drive when you no longer are taking prescription pain medication, you can comfortably wear a seatbelt, and you can safely maneuver your car and apply brakes °b. Return to Work: ___________________________________ °9. You should see your doctor in the office for a follow-up appointment approximately two weeks after your surgery.  Make sure that you call for this appointment within a day or two after you arrive home to insure a convenient appointment time. °OTHER INSTRUCTIONS:  °_____________________________________________________________ °_____________________________________________________________ ° °WHEN TO CALL YOUR DOCTOR: °1. Fever over 101.0 °2. Inability to urinate °3. Nausea and/or vomiting °4. Extreme swelling or bruising °5. Continued bleeding from incision. °6. Increased pain, redness, or drainage from the incision. °7. Difficulty swallowing or breathing °8. Muscle cramping or spasms. °9. Numbness or tingling in hands or feet or around lips. ° °The clinic staff is available to   answer your questions during regular business hours.  Please dont hesitate to call and ask to speak to one of the nurses if you have concerns.  For further questions, please visit [email or web address]      Low-Fiber Eating Plan Follow  for 7 days after discharge then transition to a high fiber diet Fiber is found in fruits, vegetables, whole grains, and beans. Eating a diet low in fiber helps to reduce how often you have bowel movements and how much you produce during a bowel movement. A low-fiber eating plan may help your digestive system heal if:  You have certain conditions, such as Crohn's disease or diverticulitis.  You recently had radiation therapy on your pelvis or bowel.  You recently had intestinal surgery.  You have a new surgical opening in your abdomen (colostomy or ileostomy).  Your intestine is narrowed (stricture). Your health care provider will determine how long you need to stay on this diet. Your health care provider may recommend that you work with a diet and nutrition specialist (dietitian). What are tips for following this plan? General guidelines  Follow recommendations from your dietitian about how much fiber you should have each day.  Most people on this eating plan should try to eat less than 10 grams (g) of fiber each day. Your daily fiber goal is _________________ g.  Take vitamin and mineral supplements as told by your health care provider or dietitian. Chewable or liquid forms are best when on this eating plan. Reading food labels  Check food labels for the amount of dietary fiber.  Choose foods that have less than 2 grams of fiber in one serving. Cooking  Use white flour and other allowed grains for baking and cooking.  Cook meat using methods that keep it tender, such as braising or poaching.  Cook eggs until the yolk is completely solid.  Cook with healthy oils, such as olive oil or canola oil. Meal planning   Eat 5-6 small meals throughout the day instead of 3 large meals.  If you are lactose intolerant: ? Choose low-lactose dairy foods. ? Do not eat dairy foods, if told by your dietitian.  Limit fat and oils to less than 8 teaspoons a day.  Eat small portions of  desserts. What foods are allowed? The items listed below may not be a complete list. Talk with your dietitian about what dietary choices are best for you. Grains All bread and crackers made with white flour. Waffles, pancakes, and [place] toast. Bagels. Pretzels. Melba toast, zwieback, and matzoh. Cooked and dried cereals that do not contain whole grains, added fiber, seeds, or dried fruit. CornmealDomenick Gong. Hot and cold cereals made with refined corn, wheat, rice, or oats. Plain pasta and noodles. White rice. Vegetables Well-cooked or canned vegetables without skin, seeds, or stems. Cooked potatoes without skins. Vegetable juice. Fruits Soft-cooked or canned fruits without skin and seeds. Peeled ripe banana. Applesauce. Fruit juice without pulp. Meats and other protein foods Ground meat. Tender cuts of meat or poultry. Eggs. Fish, seafood, and shellfish. Smooth nut butters. Tofu. Dairy All milk products and drinks. Lactose-free milks, including rice, soy, and almond milks. Yogurt without fruit, nuts, chocolate, or granola mix-ins. Sour cream. Cottage cheese. Cheese. Beverages Decaf coffee. Fruit and vegetable juices or smoothies (in small amounts, with no pulp or skins, and with fruits from allowed list). Sports drinks. Herbal tea. Fats and oils Olive oil, canola oil, sunflower oil, flaxseed oil, and grapeseed oil. Mayonnaise. Cream cheese. Margarine. Butter. Sweets and  desserts Plain cakes and cookies. Cream pies and pies made with allowed fruits. Pudding. Custard. Fruit gelatin. Sherbet. Popsicles. Ice cream without nuts. Plain hard candy. Honey. Jelly. Molasses. Syrups, including chocolate syrup. Chocolate. Marshmallows. Gumdrops. Seasoning and other foods Bouillon. Broth. Cream soups made from allowed foods. Strained soup. Casseroles made with allowed foods. Ketchup. Mild mustard. Mild salad dressings. Plain gravies. Vinegar. Spices in moderation. Salt. Sugar. What foods are not  allowed? The items listed below may not be a complete list. Talk with your dietitian about what dietary choices are best for you. Grains Whole wheat and whole grain breads and crackers. Multigrain breads and crackers. Rye bread. Whole grain or multigrain cereals. Cereals with nuts, raisins, or coconut. Bran. Coarse wheat cereals. Granola. High-fiber cereals. Cornmeal or corn bread. Whole grain pasta. Wild or brown rice. Quinoa. Popcorn. Buckwheat. Wheat germ. Vegetables Potato skins. Raw or undercooked vegetables. All beans and bean sprouts. Cooked greens. Corn. Peas. Cabbage. Beets. Broccoli. Brussels sprouts. Cauliflower. Mushrooms. Onions. Peppers. Parsnips. Okra. Sauerkraut. Fruit Raw or dried fruit. Berries. Fruit juice with pulp. Prune juice. Meats and other protein foods Tough, fibrous meats with gristle. Fatty meat. Poultry with skin. Fried meat, Sales executive, or fish. Deli or lunch meats. Sausage, bacon, and hot dogs. Nuts and chunky nut butter. Dried peas, beans, and lentils. Dairy Yogurt with fruit, nuts, chocolate, or granola mix-ins. Beverages Caffeinated coffee and teas. Fats and oils Avocado. Coconut. Sweets and desserts Desserts, cookies, or candies that contain nuts or coconut. Dried fruit. Jams and preserves with seeds. Marmalade. Any dessert made with fruits or grains that are not allowed. Seasoning and other foods Corn tortilla chips. Soups made with vegetables or grains that are not allowed. Relish. Horseradish. Angie Fava. Olives. Summary  Most people on a low-fiber eating plan should eat less than 10 grams of fiber a day. Follow recommendations from your dietitian about how much fiber you should have each day.  Always check food labels to see the dietary fiber content of packaged foods. In general, a low-fiber food will have fewer than 2 grams of fiber per serving.  In general, try to avoid whole grains, raw fruits and vegetables, dried fruit, tough cuts of meat, nuts, and  seeds.  Take a vitamin and mineral supplement as told by your health care provider or dietitian. This information is not intended to replace advice given to you by your health care provider. Make sure you discuss any questions you have with your health care provider. Document Released: 02/02/2002 Document Revised: 10/16/2016 Document Reviewed: 10/16/2016 Elsevier Interactive Patient Education  2019 [employer].

## 2019-01-05 NOTE — Progress Notes (Signed)
PROGRESS NOTE    Danielle Griffin  LHT:[id number] DOB: October 22, 1960 DOA: 12/25/2018 PCP: [facility]   Brief Narrative:  Is a 58 year old African-American obese female with a past medical history significant for but not limited to essential hypertension, anemia, history of cystitis, history of leukopenia, menorrhagia, history of allergic rhinitis, and other comorbidities who presented with a chief complaint of abdominal pain.  Further work-up showed that she had a small bowel obstruction and NG tube was placed.  Because she failed conservative management and medical therapy is she was taken to the OR for an exploratory laparotomy lysis of adhesions on 12/30/2018.  She is postoperative day 6 and no longer complaining of Abdominal Pain but had not passed gas or had a bowel movement until last night when she had a large BM and flatus overnight. Foley has been removed and her R Midline was occluded but now improved.    Since bowel function did not return the day before yesterday she was started on TPN for prolonged ileus and General Surgery adding Reglan prn yesterday.  Because she had a bowel movement now Dr. Marlou Starks recommending continuing TPN  And tapering it off and discontinue if she tolerates diet. She was on a CLD and D/C'd NGT and diet was advanced to FULL Liquids and will be advanced as tolerated. Hospital was complicated by her complaining about urinary urgency so checked for UTI and showed >100,000 CFU of GNR with Cx and Sensitivities pending.   Assessment & Plan:   Principal Problem:   Abdominal pain Active Problems:   Essential hypertension   Hypokalemia   AKI (acute kidney injury) (HCC)   Normocytic anemia   Hyperglycemia   Obesity (BMI 30-39.9)  Small Bowel Obstruction status post exploratory laparotomy and lysis of adhesions by Dr. Ninfa Linden on 01/01/2619 now complicated with Prolonged Ileus, improving  -Has had multiple risk factors for small bowel obstruction given  her extensive surgical history of tubal ligation and robotic assisted laparoscopic vaginal hysterectomy -CT Abdomen and Pelvis w/o Contrast showed that there were numerous loops of distended small bowel in the mid abdomen with the largest loops measuring up to 4 cm.  The distal small bowel was decompressed.  Findings were consistent with small bowel obstruction and then transition point was difficult to identify although suspected to be in the vicinity of the left adnexa -NG tube was placed and this is now been discontinued as she is having a bowel movement -Failed clamp trial previously and had Minimal BM with suppository so was taken to Surgery for Intervention  -Unfortunately failed conservative therapies and medical management and was taken for surgical intervention with a exploratory laparotomy and lysis of adhesions she is postoperative Day 6 -General surgery recommendations:  Advance diet as tolerated and TPN is to stop today. -Currently pain is controlled with hydrocodone-acetaminophen 1 tab (7.5-325 mg) p.o. every 4 as needed moderate pain; IV morphine PCA with naloxone now discontinued and she is on IV Morphine 2-4 mg q2hprn -She has a Bowel Regimen with Bisacodyl 10 mg RC q12hprn Mild to Moderate Constipation and  -Supportive care with antiemetics with p.o./IV Zofran and Compazine -Continue with Robaxin thousand milligrams IV every 6 hours PRN for muscle spasms -Was getting D5 half-normal saline now rate reduced to 20 mL/hr  -Diet advancement per general surgery once bowel function returns but is currently n.p.o. except meds and sips and started on TPN and getting it continuous at 65 mL/hr and is to be tapered off -Surgery  adding IV Metaclopramide 5 mg IV q8hprn Nausea  -Clear liquid diet is being advanced to full liquid diet and diet is being advanced as tolerated -If Patient can tolerate soft diet she can go home per General Surgery Standpoint but she still needs to be evaluated for this  UTI  Acute Kidney Injury, improved  -Unknown baseline. Creatinine of 2.4 on admission.  -Trending down with IV fluids and BUN/Cr is now 16/1.01 -D5 1/2 NS will continue at 20 mL/hr and discontinue later today -Avoid nephrotoxic medications, contrast dyes as well as hypotension -Continue to monitor and trend renal function and repeat CMP in the a.m.  Essential Hypertension -On Hydrochlorothiazide and Olmesartan as an outpatient.  -Held secondary to AKI and dehydration -C/w IVF hydration as above -Continue Hydralazine 10 mg IV q4hprn -BP was now 157/92  Hypokalemia -Patient's potassium is now 4.4 -Continue monitor and replete as necessary -Repeat CMP in a.m.  Normocytic Anemia -Patient's potassium went from 12.7/40.5 -> 11.9/38.8 -> 10.0/32.4 -> 9.5/31.3 -> 9.6/30.7 -> 9.8/30.8 -> 9.4/29.5 -Likely in setting of dilutional drop and postsurgical drop -Checked Anemia Panel showed iron level of 29, U IBC 191, TIBC of 220, saturation ratios of 13%, ferritin level 484, folate level 14.1, and vitamin B12 level of 608 -Continue to monitor for signs and symptoms of bleeding carefully -Continue monitor and trend CBC  Hyperglycemia -In the setting of D5 IVF -Patient's glucose on BMP is been ranging from 104-147; BS this AM was 128 -CBG's ranging from 91-132 -Checked HbA1c and was 5.0 -Continue monitor and trend blood sugars and if remain consistently elevated will place on sensitive NovoLog/scale insulin AC  Obesity -Estimated body mass index is 36.39 kg/m as calculated from the following:   Height as of this encounter: 5\' 5"  (1.651 m).   Weight as of this encounter: 99.2 kg. -Weight Loss and Dietary Counseling given  -Dietician is on board  Abnormal LFTs and Hyperbilirubinemia -Patient's AST went from 28 -> 40 -> 64 -> 62 -> 63 -Patient's ALT went from 35 -> 35 -> 45 -> 51 -> 56 -T Bili Went from 0.8 -> 0.9 -> 1.3 and is now improved at 0.8 -In the setting of TPN -Check RUQ U/S  and Acute Hepatitis Panel -RUQ U/S showed "No acute findings in the RIGHT upper quadrant ultrasound." -Acute Hepatitis Panel pending  -Continue to Monitor and Trend Hepatic Function -Repeat CMP in AM  Gram-negative urinary tract infection -Urinalysis showed Hazy Appearance, Small Leukocytes, Rare Bacteria and >50 WBC; Negative Nitrites  -Urinalysis Cx >= 100,000 colonies gram-negative rods with sensitivities still pending -Will Empirically Start IV Ceftriaxone for at least 3 Days and narrow antibiotics based on sensitivities -Continue to Monitor closely   DVT prophylaxis: Enoxaparin 40 mg sq q24h Code Status: FULL CODE Family Communication: No family present at bedside  Disposition Plan: Pending Tolerance of Diet and likely can be discharged in next 24 to 48 hours if stable and TPN is weaned off.  General surgery states that if she tolerates a diet she can be discharged from their standpoint.  Consultants:   General Surgery   Procedures:  NGT (4/30 -> Current) -PICC Placement with TPN started on 01/02/2019   Antimicrobials:  Anti-infectives (From admission, onward)   Start     Dose/Rate Route Frequency Ordered Stop   01/04/19 1400  cefTRIAXone (ROCEPHIN) 1 g in sodium chloride 0.9 % 100 mL IVPB     1 g 200 mL/hr over 30 Minutes Intravenous Every 24 hours 01/04/19 1315  12/30/18 1200  ciprofloxacin (CIPRO) IVPB 400 mg    Note to Pharmacy:  preop For surgery today   400 mg 200 mL/hr over 60 Minutes Intravenous  Once 12/30/18 9326 12/30/18 1530     Subjective: Seen and examined at bedside and states that she was a little out of breath after she had just ambulated in the halls.  States that she is been up all night urinating frequently.  No chest pain, lightheadedness or dizziness.  No nausea or vomiting.  No other concerns or complaints at this time.  And hopeful to go home soon.  States that she tolerated her full liquid diet this morning.  Objective: Vitals:   01/04/19  1334 01/04/19 1452 01/04/19 2148 01/05/19 0553  BP: (!) 156/102  (!) 161/85 (!) 157/92  Pulse: 95  96 90  Resp:   20 20  Temp: 97.7 F (36.5 C)  98.4 F (36.9 C) 98.6 F (37 C)  TempSrc: Oral  Oral Oral  SpO2: 100% 99% 97% 96%  Weight:      Height:        Intake/Output Summary (Last 24 hours) at 01/05/2019 1247 Last data filed at 01/05/2019 1108 Gross per 24 hour  Intake 3100 ml  Output 1050 ml  Net 2050 ml   Filed Weights   01/02/19 0500 01/03/19 0504 01/04/19 0500  Weight: 101.8 kg 104.9 kg 99.2 kg   Examination: Physical Exam:  Constitutional: Well-nourished, well-developed obese African-American female currently no acute distress appears calm but is a little out of breath after she is just ambulated and she is tolerating a full liquid diet now. Eyes: Lids and conjunctive are normal.  Sclera anicteric ENMT: External ears nose appear normal.  Grossly normal hearing.  NG tube is been removed Neck: Appears supple no JVD Respiratory: Slightly tachypneic but not using any accessory muscles to breathe.  A slightly diminished auscultation bilaterally no appreciable wheezing, rales, rhonchi patient was not tachypneic or using accessory muscle breathe Cardiovascular: Tachycardic rate but regular rhythm.  No appreciable murmurs, rubs, gallops.  No lower extremity edema noted. Abdomen: Soft, nontender, distended secondary body habitus.  Bowel sounds present GU: Deferred  Musculoskeletal: No contractures or cyanosis.  No joint deformities in the upper and lower extremities Skin: Skin is warm and dry no appreciable rashes or lesions on limited skin evaluation.  Abdominal dressing is clean dry and intact Neurologic: Cranial nerves II through XII grossly intact no appreciable focal deficits.  Romberg sign cerebellar reflexes were not assessed Psychiatric: Pleasant mood and affect.  Intact judgment and insight.  Patient is awake, alert, oriented x3.  Data Reviewed: I have personally  reviewed following labs and imaging studies  CBC: Recent Labs  Lab 01/01/19 0458 01/02/19 0453 01/03/19 0439 01/04/19 0442 01/05/19 0351  WBC 7.5 6.4 6.8 9.6 10.9*  NEUTROABS 4.9 4.4 5.1 7.4 8.5*  HGB 10.0* 9.5* 9.6* 9.8* 9.4*  HCT 32.4* 31.3* 30.7* 30.8* 29.5*  MCV 99.4 97.8 96.5 96.3 96.4  PLT 217 234 231 246 712   Basic Metabolic Panel: Recent Labs  Lab 01/01/19 0458 01/02/19 0453 01/03/19 0439 01/04/19 0442 01/05/19 0351  NA 139 139 139 138 137  K 3.7 3.3* 3.2* 3.9 4.4  CL 104 105 100 102 103  CO2 29 29 29 27 25   GLUCOSE 108* 104* 126* 132* 128*  BUN 15 11 12 16 16   CREATININE 1.08* 1.01* 1.03* 1.04* 1.01*  CALCIUM 8.6* 8.7* 9.0 9.0 9.2  MG 2.3 2.1 2.0 2.0 1.9  PHOS 3.1 3.3 3.3 3.6 4.1   GFR: Estimated Creatinine Clearance: 70 mL/min (A) (by C-G formula based on SCr of 1.01 mg/dL (H)). Liver Function Tests: Recent Labs  Lab 01/01/19 0458 01/02/19 0453 01/03/19 0439 01/04/19 0442 01/05/19 0351  AST 28 40 64* 62* 63*  ALT 35 35 45* 51* 56*  ALKPHOS 79 109 170* 176* 208*  BILITOT 0.8 0.9 1.3* 0.8 0.8  PROT 6.0* 5.8* 5.8* 6.1* 6.2*  ALBUMIN 3.3* 3.1* 3.0* 3.1* 3.1*   No results for input(s): LIPASE, AMYLASE in the last 168 hours. No results for input(s): AMMONIA in the last 168 hours. Coagulation Profile: No results for input(s): INR, PROTIME in the last 168 hours. Cardiac Enzymes: No results for input(s): CKTOTAL, CKMB, CKMBINDEX, TROPONINI in the last 168 hours. BNP (last 3 results) No results for input(s): PROBNP in the last 8760 hours. HbA1C: No results for input(s): HGBA1C in the last 72 hours. CBG: Recent Labs  Lab 01/03/19 0505 01/03/19 1129 01/03/19 1806 01/03/19 2349 01/04/19 0514  GLUCAP 116* 105* 86 148* 123*   Lipid Profile: Recent Labs    01/05/19 0351 01/05/19 1032  TRIG 232* 247*   Thyroid Function Tests: No results for input(s): TSH, T4TOTAL, FREET4, T3FREE, THYROIDAB in the last 72 hours. Anemia Panel: No results for  input(s): VITAMINB12, FOLATE, FERRITIN, TIBC, IRON, RETICCTPCT in the last 72 hours. Sepsis Labs: No results for input(s): PROCALCITON, LATICACIDVEN in the last 168 hours.  Recent Results (from the past 240 hour(s))  MRSA PCR Screening     Status: None   Collection Time: 12/30/18  8:56 AM  Result Value Ref Range Status   MRSA by PCR NEGATIVE NEGATIVE Final    Comment:        The GeneXpert MRSA Assay (FDA approved for NASAL specimens only), is one component of a comprehensive MRSA colonization surveillance program. It is not intended to diagnose MRSA infection nor to guide or monitor treatment for MRSA infections. Performed at [facility], [place] [address], [place], [place] [address]   Culture, Urine     Status: Abnormal (Preliminary result)   Collection Time: 01/04/19  7:53 AM  Result Value Ref Range Status   Specimen Description   Final    URINE, CLEAN CATCH Performed at [facility], [place] [address] [place], [place] [address]    Special Requests   Final    NONE Performed at [facility], [place] [address], [place], [place] [address]    Culture (A)  Final    >=100,000 COLONIES/mL GRAM NEGATIVE RODS IDENTIFICATION AND SUSCEPTIBILITIES TO FOLLOW Performed at [facility], [place] [address] [place], [place] [address]    Report Status PENDING  Incomplete     Radiology Studies: No results found. Scheduled Meds: . enoxaparin (LOVENOX) injection  40 mg Subcutaneous Q24H  . lip balm  1 application Topical BID  . sodium chloride flush  10-40 mL Intracatheter Q12H  . sodium chloride flush  10-40 mL Intracatheter Q12H   Continuous Infusions: . sodium chloride    . cefTRIAXone (ROCEPHIN)  IV 1 g (01/04/19 1416)  . dextrose 5 % and 0.45% NaCl    . methocarbamol (ROBAXIN) IV Stopped (12/28/18 1510)  . ondansetron (ZOFRAN) IV    . TPN ADULT (ION) 80 mL/hr at 01/04/19 1716  . TPN ADULT (ION)      LOS: 10 days   Kerney Elbe, DO [facility] PAGER is on [place]  If 7PM-7AM,  please contact night-coverage [email or web address] Password [facility] 01/05/2019, 12:47 PM

## 2019-01-05 NOTE — Progress Notes (Signed)
[facility]/Trauma Progress Note  6 Days Post-Op   Assessment/Plan SBO -Surg hx =tubal ligation,&robotic assisted laparoscopic vaginal hysterectomy  -S/P ex lap, LOA, Dr. Ninfa Linden, 05/05 post op ileus -resolving -continue tpn until pt tolerates a diet  FEN: TPN, FLD and advance to soft as tolerated VTE: SCD's, lovenox HY:IFOYD pre-op, Rocephin for UTI per medicine Foley:none Follow up:Dr. Ninfa Linden  DISPO: advance diet as tolerated. If she tolerates a soft diet today she can be discharged from a surgical standpoint.     LOS: 10 days    Subjective: CC: no complaints  Pt states she is having bowel function. No nausea, vomiting, fever or chills overnight. Tolerating CLD.   Objective: Vital signs in last 24 hours: Temp:  [97.7 F (36.5 C)-98.6 F (37 C)] 98.6 F (37 C) (05/11 0553) Pulse Rate:  [90-96] 90 (05/11 0553) Resp:  [20] 20 (05/11 0553) BP: (156-161)/(85-102) 157/92 (05/11 0553) SpO2:  [96 %-100 %] 96 % (05/11 0553) Last BM Date: 01/05/19  Intake/Output from previous day: 05/10 0701 - 05/11 0700 In: 3680 [P.O.:1500; I.V.:2080; IV Piggyback:100] Out: 1200 [Urine:1200] Intake/Output this shift: No intake/output data recorded.  PE: Gen: Alert, NAD, pleasant, cooperative Pulm:Rate andeffort normal Abd: Soft,obese,ND,+BS,incision with staples in place is well appearing and is without signs of infection. Mild generalized TTP without guarding, no peritonitis Skin: no rashes noted, warm and dry    Anti-infectives: Anti-infectives (From admission, onward)   Start     Dose/Rate Route Frequency Ordered Stop   01/04/19 1400  cefTRIAXone (ROCEPHIN) 1 g in sodium chloride 0.9 % 100 mL IVPB     1 g 200 mL/hr over 30 Minutes Intravenous Every 24 hours 01/04/19 1315     12/30/18 1200  ciprofloxacin (CIPRO) IVPB 400 mg    Note to Pharmacy:  preop For surgery today   400 mg 200 mL/hr over 60 Minutes Intravenous  Once 12/30/18 0826  12/30/18 1530      Lab Results:  Recent Labs    01/04/19 0442 01/05/19 0351  WBC 9.6 10.9*  HGB 9.8* 9.4*  HCT 30.8* 29.5*  PLT 246 243   BMET Recent Labs    01/04/19 0442 01/05/19 0351  NA 138 137  K 3.9 4.4  CL 102 103  CO2 27 25  GLUCOSE 132* 128*  BUN 16 16  CREATININE 1.04* 1.01*  CALCIUM 9.0 9.2   PT/INR No results for input(s): LABPROT, INR in the last 72 hours. CMP     Component Value Date/Time   NA 137 01/05/2019 0351   K 4.4 01/05/2019 0351   CL 103 01/05/2019 0351   CO2 25 01/05/2019 0351   GLUCOSE 128 (H) 01/05/2019 0351   BUN 16 01/05/2019 0351   CREATININE 1.01 (H) 01/05/2019 0351   CALCIUM 9.2 01/05/2019 0351   PROT 6.2 (L) 01/05/2019 0351   ALBUMIN 3.1 (L) 01/05/2019 0351   AST 63 (H) 01/05/2019 0351   ALT 56 (H) 01/05/2019 0351   ALKPHOS 208 (H) 01/05/2019 0351   BILITOT 0.8 01/05/2019 0351   GFRNONAA >60 01/05/2019 0351   GFRAA >60 01/05/2019 0351   Lipase     Component Value Date/Time   LIPASE 26 12/25/2018 0751    Studies/Results: US Abdomen Limited Ruq  Result Date: 01/03/2019 CLINICAL DATA:  Abnormal LFTs. EXAM: ULTRASOUND ABDOMEN LIMITED RIGHT UPPER QUADRANT COMPARISON:  None. FINDINGS: Gallbladder: No gallstones or wall thickening visualized. No sonographic Murphy sign noted by sonographer. Common bile duct: Diameter: Normal at 3.4 cm Liver: No  focal lesion identified. Within normal limits in parenchymal echogenicity. Portal vein is patent on color Doppler imaging with normal direction of blood flow towards the liver. IMPRESSION: No acute findings in the RIGHT upper quadrant ultrasound. Electronically Signed   By: Suzy Bouchard M.D.   On: 01/03/2019 09:49      Kalman Drape , [facility] 01/05/2019, 8:33 AM  Pager: [phone] Mon-Wed, Friday 7:00am-4:30pm Thurs 7am-11:30am  Consults: [phone]

## 2019-01-05 NOTE — Progress Notes (Signed)
[place] NOTE   Pharmacy Consult for TPN Indication: prolonged ileus  Patient Measurements: Height: '5\' 5"'  (165.1 cm) Weight: 218 lb 11.1 oz (99.2 kg) IBW/kg (Calculated) : 57 TPN AdjBW (KG): 70 Body mass index is 36.39 kg/m. Usual Weight:   HPI:  Danielle Griffin admitted 4/30 with SBO, s/p ex lap/LOA on 5/5, now with prolonged postop ileus. Pharmacy consulted to dose TPN until tolerating diet.  Central access: PICC placed 5/8 for TPN TPN start date: 5/8  ASSESSMENT                                                                                                          Current Nutrition: NPO, TPN at 80 ml/hr IVF: D5-1/2NS 20 ml/hr   Significant events:  - 5/11: advance to full liquids, having BMs  Today:   Glucose: no hx DM, not checking CBGs; SBGs well controlled (range 126-132 since starting TPN)  Electrolytes: stable WNL  Renal: AKI resolved, unknown baseline but SCr stable ~1.0; BUN stable, bicarb WNL but trending down slowly  LFTs: AST/ALT, Alk Phos all slightly elevated, continue to rise today; Tbili stable WNL, albumin slightly low but stable  TGs: elevated and rising but still < 400  Prealbumin: low but improving  NUTRITIONAL GOALS                                                                                             RD recs: 01/02/19  Kcal:  1700-1900 Protein:  70-80g Fluid:  1.9L/day  Keep K >/= 4.0 and Mg >/= 2.0 with ileus  PLAN                                                                                                                    At 1800 today:  continue TPN, adjust to 65 ml/hr  Current TPN provides 78g protein and 1680 kcal/day, meeting 100% protein and nearly 100% kcal needs  Electrolytes in TPN: increased Mg, other standard; Cl:Ac ratio 1:1  TPN to contain standard multivitamins daily and trace elements on MWF only due to national shortage  Remove dextrose from IVF, increase to 30 ml/hr with  change in TPN rate  Not checking CBGs d/t good control  TPN lab panels on Mondays & Thursdays, CMET, TG, Mg, Phos tomorrow   Reuel Boom, PharmD, BCPS [phone] 01/05/2019, 9:10 AM

## 2019-01-06 LAB — URINE CULTURE: Culture: >=100000 — AB

## 2019-01-06 LAB — CBC WITH DIFFERENTIAL/PLATELET
Abs Immature Granulocytes: 0.42 10*3/uL — ABNORMAL HIGH (ref 0.00–0.07)
Basophils Absolute: 0.1 10*3/uL (ref 0.0–0.1)
Basophils Relative: 1 %
Eosinophils Absolute: 0.4 10*3/uL (ref 0.0–0.5)
Eosinophils Relative: 4 %
HCT: 30.6 % — ABNORMAL LOW (ref 36.0–46.0)
Hemoglobin: 9.5 g/dL — ABNORMAL LOW (ref 12.0–15.0)
Immature Granulocytes: 4 %
Lymphocytes Relative: 10 %
Lymphs Abs: 1 10*3/uL (ref 0.7–4.0)
MCH: 30.2 pg (ref 26.0–34.0)
MCHC: 31 g/dL (ref 30.0–36.0)
MCV: 97.1 fL (ref 80.0–100.0)
Monocytes Absolute: 0.7 10*3/uL (ref 0.1–1.0)
Monocytes Relative: 7 %
Neutro Abs: 7.1 10*3/uL (ref 1.7–7.7)
Neutrophils Relative %: 74 %
Platelets: 273 10*3/uL (ref 150–400)
RBC: 3.15 MIL/uL — ABNORMAL LOW (ref 3.87–5.11)
RDW: 13.5 % (ref 11.5–15.5)
WBC: 9.7 10*3/uL (ref 4.0–10.5)
nRBC: 0 % (ref 0.0–0.2)

## 2019-01-06 LAB — COMPREHENSIVE METABOLIC PANEL
ALT: 67 U/L — ABNORMAL HIGH (ref 0–44)
AST: 75 U/L — ABNORMAL HIGH (ref 15–41)
Albumin: 3.3 g/dL — ABNORMAL LOW (ref 3.5–5.0)
Alkaline Phosphatase: 263 U/L — ABNORMAL HIGH (ref 38–126)
Anion gap: 9 (ref 5–15)
BUN: 19 mg/dL (ref 6–20)
CO2: 23 mmol/L (ref 22–32)
Calcium: 9.2 mg/dL (ref 8.9–10.3)
Chloride: 103 mmol/L (ref 98–111)
Creatinine, Ser: 0.99 mg/dL (ref 0.44–1.00)
GFR calc Af Amer: >60 mL/min (ref >60)
GFR calc non Af Amer: >60 mL/min (ref >60)
Glucose, Bld: 134 mg/dL — ABNORMAL HIGH (ref 70–99)
Potassium: 4.5 mmol/L (ref 3.5–5.1)
Sodium: 135 mmol/L (ref 135–145)
Total Bilirubin: 0.6 mg/dL (ref 0.3–1.2)
Total Protein: 6.5 g/dL (ref 6.5–8.1)

## 2019-01-06 LAB — HEPATITIS PANEL, ACUTE
HCV Ab: <0.1 s/co ratio (ref 0.0–0.9)
Hep A IgM: NEGATIVE
Hep B C IgM: NEGATIVE
Hepatitis B Surface Ag: NEGATIVE

## 2019-01-06 LAB — MAGNESIUM: Magnesium: 2.2 mg/dL (ref 1.7–2.4)

## 2019-01-06 LAB — PHOSPHORUS: Phosphorus: 4.5 mg/dL (ref 2.5–4.6)

## 2019-01-06 MED ORDER — PIPERACILLIN-TAZOBACTAM 3.375 G IVPB 30 MIN
3.3750 g | Freq: Once | INTRAVENOUS | Status: DC
  Filled 2019-01-06: qty 50

## 2019-01-06 MED ORDER — NITROFURANTOIN MONOHYD MACRO 100 MG PO CAPS: 100.0000 mg | ORAL_CAPSULE | Freq: Two times a day (BID) | ORAL | 0 refills | Status: AC

## 2019-01-06 MED ORDER — NITROFURANTOIN MONOHYD MACRO 100 MG PO CAPS
100.0000 mg | ORAL_CAPSULE | Freq: Two times a day (BID) | ORAL | Status: DC
  Administered 2019-01-06: 100 mg via ORAL
  Filled 2019-01-06: qty 1

## 2019-01-06 MED ORDER — HYDROCODONE-ACETAMINOPHEN 7.5-325 MG PO TABS: 1.0000 | ORAL_TABLET | ORAL | 0 refills | Status: AC | PRN

## 2019-01-06 MED ORDER — LIP MEDEX EX OINT: 1.0000 "application " | TOPICAL_OINTMENT | Freq: Two times a day (BID) | CUTANEOUS | 0 refills | Status: AC

## 2019-01-06 NOTE — Progress Notes (Signed)
[facility]/Trauma Progress Note  7 Days Post-Op   Assessment/Plan SBO -Surg hx =tubal ligation,&robotic assisted laparoscopic vaginal hysterectomy  -S/P ex lap, LOA, Dr. Ninfa Linden, 05/05 post op ileus -resolved  FEN: soft  VTE: SCD's, lovenox [id number] pre-op, Zosyn for UTI per medicine Foley:none Follow up:Dr. Ninfa Linden  DISPO: okay for discharged from a surgical standpoint.    LOS: 11 days    Subjective: CC: abdominal soreness  No new complaints. No issues overnight. No nausea, vomiting, fever or chills. She is tolerating her diet.   Objective: Vital signs in last 24 hours: Temp:  [98.1 F (36.7 C)-98.6 F (37 C)] 98.6 F (37 C) (05/12 0604) Pulse Rate:  [90-94] 92 (05/12 0604) Resp:  [14-18] 14 (05/12 0604) BP: (152-155)/(88-95) 154/88 (05/12 0604) SpO2:  [97 %-99 %] 97 % (05/12 0604) Last BM Date: 01/05/19  Intake/Output from previous day: 05/11 0701 - 05/12 0700 In: 1100 [P.O.:1080; I.V.:20] Out: 750 [Urine:750] Intake/Output this shift: No intake/output data recorded.  PE: Gen: Alert, NAD, pleasant, cooperative Pulm:Rate andeffort normal Abd: Soft,obese,ND,+BS,incision with staples in place is well appearing and is without signs of infection. Mild generalized TTP withoutguarding, no peritonitis Skin: no rashes noted, warm and dry  Anti-infectives: Anti-infectives (From admission, onward)   Start     Dose/Rate Route Frequency Ordered Stop   01/04/19 1400  cefTRIAXone (ROCEPHIN) 1 g in sodium chloride 0.9 % 100 mL IVPB  Status:  Discontinued     1 g 200 mL/hr over 30 Minutes Intravenous Every 24 hours 01/04/19 1315 01/06/19 0831   12/30/18 1200  ciprofloxacin (CIPRO) IVPB 400 mg    Note to Pharmacy:  preop For surgery today   400 mg 200 mL/hr over 60 Minutes Intravenous  Once 12/30/18 0826 12/30/18 1530      Lab Results:  Recent Labs    01/05/19 0351 01/06/19 0442  WBC 10.9* 9.7  HGB 9.4* 9.5*  HCT 29.5*  30.6*  PLT 243 273   BMET Recent Labs    01/05/19 1328 01/06/19 0442  NA 136 135  K 4.5 4.5  CL 102 103  CO2 23 23  GLUCOSE 121* 134*  BUN 17 19  CREATININE 0.96 0.99  CALCIUM 9.2 9.2   PT/INR No results for input(s): LABPROT, INR in the last 72 hours. CMP     Component Value Date/Time   NA 135 01/06/2019 0442   K 4.5 01/06/2019 0442   CL 103 01/06/2019 0442   CO2 23 01/06/2019 0442   GLUCOSE 134 (H) 01/06/2019 0442   BUN 19 01/06/2019 0442   CREATININE 0.99 01/06/2019 0442   CALCIUM 9.2 01/06/2019 0442   PROT 6.5 01/06/2019 0442   ALBUMIN 3.3 (L) 01/06/2019 0442   AST 75 (H) 01/06/2019 0442   ALT 67 (H) 01/06/2019 0442   ALKPHOS 263 (H) 01/06/2019 0442   BILITOT 0.6 01/06/2019 0442   GFRNONAA >60 01/06/2019 0442   GFRAA >60 01/06/2019 0442   Lipase     Component Value Date/Time   LIPASE 26 12/25/2018 0751    Studies/Results: No results found.    Kalman Drape , [facility] 01/06/2019, 8:45 AM  Pager: [phone] Mon-Wed, Friday 7:00am-4:30pm Thurs 7am-11:30am  Consults: [phone]

## 2019-01-06 NOTE — Progress Notes (Signed)
Pharmacy - TPN  Assessment: 13 yoF on TPN for postop ileus, now resolved. OK to stop TPN today per Surgery service  Plan:   Reduce TPN rate now to 40 ml/hr in anticipation of stopping early this afternoon whenever discharged  Reuel Boom, PharmD, BCPS [phone] 01/06/2019, 9:12 AM

## 2019-01-06 NOTE — Discharge Summary (Signed)
Physician Discharge Summary  Danielle Griffin NLZ:[id number] DOB: [birth year] DOA: 12/25/2018  PCP: [facility]  Admit date: 12/25/2018 Discharge date: 01/06/2019  Admitted From: Home Disposition: Home  Recommendations for Outpatient Follow-up:  1. Follow up with PCP in 1-2 weeks 2. Follow up with General Surgery within 1-2 weeks 3. Please obtain CMP/CBC, Mag, Phos in one week (LFTs were elevated) 4. Please follow up on the following pending results:  Home Health: No Equipment/Devices: None    Discharge Condition: Stable  CODE STATUS: FULL CODE Diet recommendation: Heart Healthy Soft Diet  Brief/Interim Summary: The patient is a 58 year old African-American obese female with a past medical history significant for but not limited to essential hypertension, anemia, history of cystitis, history of leukopenia, menorrhagia, history of allergic rhinitis, and other comorbidities who presented with a chief complaint of abdominal pain.  Further work-up showed that she had a small bowel obstruction and NG tube was placed.  Because she failed conservative management and medical therapy is she was taken to the OR for an exploratory laparotomy lysis of adhesions on 12/30/2018.  She is postoperative day 6 and no longer complaining of Abdominal Pain but had not passed gas or had a bowel movement until last night when she had a large BM and flatus overnight. Foley has been removed and her R Midline was occluded but now improved.    Since bowel function did not return tshe was started on TPN for prolonged ileus and General Surgery adding Reglan prn.  Because she had a bowel movement now Dr. Marlou Starks recommending continuing TPN  And tapering it off and discontinue if she tolerates diet. She was on a CLD and D/C'd NGT and diet was advanced to FULL Liquids and will be advanced as tolerated and now tolerated soft Diet. Hospital was complicated by her complaining about urinary urgency so checked for  UTI and showed >100,000 CFU of GNR and turned out to be ESBL EColi that was resistant to Cephalosoprins so she was placed on Macrobid for 5 Days.  She tolerated diet without issues and was deemed medically stable for discharge home and she will follow-up with General Surgery outpatient setting in 1 to 2 weeks.  Discharge Diagnoses:  Principal Problem:   Abdominal pain Active Problems:   Essential hypertension   Hypokalemia   AKI (acute kidney injury) (HCC)   Normocytic anemia   Hyperglycemia   Obesity (BMI 30-39.9)  Small Bowel Obstruction status post exploratory laparotomy and lysis of adhesions by Dr. Ninfa Linden on 9/0/2409 now complicated with Prolonged Ileus, improving  -Has had multiple risk factors for small bowel obstruction given her extensive surgical history of tubal ligation and robotic assisted laparoscopic vaginal hysterectomy -CT Abdomen and Pelvis w/o Contrast showed that there were numerous loops of distended small bowel in the mid abdomen with the largest loops measuring up to 4 cm.  The distal small bowel was decompressed.  Findings were consistent with small bowel obstruction and then transition point was difficult to identify although suspected to be in the vicinity of the left adnexa -NG tube was placed and this is now been discontinued as she is having a bowel movement -Failed clamp trial previously and had Minimal BM with suppository so was taken to Surgery for Intervention  -Unfortunately failed conservative therapies and medical management and was taken for surgical intervention with a exploratory laparotomy and lysis of adhesions she is postoperative Day 7 -General surgery recommendations: Advance diet as tolerated and TPN is to stop today  as she is being Discharged . -Currently pain is controlled with hydrocodone-acetaminophen 1 tab (7.5-325 mg) p.o. every 4 as needed moderate pain; IV morphine PCA with naloxone now discontinued and she is on IV Morphine 2-4 mg  q2hprn -She has a Bowel Regimen with Bisacodyl 10 mg RC q12hprn Mild to Moderate Constipation and  -Supportive care with antiemetics with p.o./IV Zofran and Compazine -Continue with Robaxin thousand milligrams IV every 6 hours PRN for muscle spasms -Was getting D5 half-normal saline now rate reduced to 20 mL/hr  -Diet advancement per general surgery once bowel function returns; Now on Soft Diet was getting TPN and getting it continuous at 65 mL/hr and is to be tapered off and to be stopped today  -Surgery adding IV Metaclopramide 5 mg IV q8hprn Nausea  -Clear liquid diet is being advanced to full liquid diet and diet is being advanced as tolerated -If Patient can tolerate soft diet she can go home per General Surgery Standpoint b  Acute Kidney Injury, improved  -Unknown baseline. Creatinine of 2.4 on admission. -Trending down with IV fluids and BUN/Cr is now 19/0.99 -Avoid nephrotoxic medications, contrast dyes as well as hypotension Resume Home HCTZ and Olmesartan at Discharge -Continue to monitor and trend renal function and repeat CMP in the a.m.  Essential Hypertension -On Hydrochlorothiazide and Olmesartan as an outpatient.  -Held secondary to AKI and dehydration but ok to resume at D/C -C/w IVF hydration as above and will stop as she is being discharged -Continue Hydralazine 10 mg IV q4hprn -BP was now 154/88  Hypokalemia -Patient's potassium is now 4.5 -Continue monitor and replete as necessary -Repeat CMP in a.m.  Normocytic Anemia -Patient's potassium went from 12.7/40.5 -> 9.5/30.6; Has been stable -Likely in setting of dilutional drop and postsurgical drop -Checked Anemia Panel showed iron level of 29, U IBC 191, TIBC of 220, saturation ratios of 13%, ferritin level 484, folate level 14.1, and vitamin B12 level of 608 -Continue to monitor for signs and symptoms of bleeding carefully -Continue monitor and trend CBC  Hyperglycemia -In the setting of D5  IVF -Patient's glucose on BMP is been ranging from 104-147; BS this AM was 128 -CBG's ranging from 91-134 -Checked HbA1c and was 5.0 -Continue monitor and trend blood sugars and if remain consistently elevated will place on sensitive NovoLog/scale insulin AC  Obesity -Estimated body mass index is 36.39 kg/m as calculated from the following:   Height as of this encounter: 5\' 5"  (1.651 m).   Weight as of this encounter: 99.2 kg. -Weight Loss and Dietary Counseling given  -Dietician is on board  Abnormal LFTs and Hyperbilirubinemia -Patient's AST went from 28 -> 40 -> 64 -> 62 -> 63 -> 75 -Patient's ALT went from 35 -> 35 -> 45 -> 51 -> 56 -> 60 -> 67 -T Bili Went from 0.8 -> 0.9 -> 1.3 and is now improved at 0.6 -In the setting of TPN -Check RUQ U/S and Acute Hepatitis Panel -RUQ U/S showed "No acute findings in the RIGHT upper quadrant ultrasound." -Acute Hepatitis Panel Negative  -Continue to Monitor and Trend Hepatic Function -Repeat CMP in AM  ESBL EColi UTI -Urinalysis showed Hazy Appearance, Small Leukocytes, Rare Bacteria and >50 WBC; Negative Nitrites  -Urinalysis Cx >= 100,000 colonies gram-negative rods and was ESBL EColi that was Sensitive to Ampicillin/Sulbactam, Gentamycin, Imipenem, Nitrofurantoin, and Piperacillin/Tazobactam  -Empirically Started IV Ceftriaxone but changed to Macrobid given Sensitivities and will D/C for 5 Day Course  -Continue to Monitor closely and  have PCP follow up   Discharge Instructions Discharge Instructions    Call MD for:  difficulty breathing, headache or visual disturbances   Complete by:  As directed    Call MD for:  extreme fatigue   Complete by:  As directed    Call MD for:  hives   Complete by:  As directed    Call MD for:  persistant dizziness or light-headedness   Complete by:  As directed    Call MD for:  persistant nausea and vomiting   Complete by:  As directed    Call MD for:  redness, tenderness, or signs of infection  (pain, swelling, redness, odor or green/yellow discharge around incision site)   Complete by:  As directed    Call MD for:  severe uncontrolled pain   Complete by:  As directed    Call MD for:  temperature >100.4   Complete by:  As directed    Diet - low sodium heart healthy   Complete by:  As directed    Soft   Discharge instructions   Complete by:  As directed    You were cared for by a hospitalist during your hospital stay. If you have any questions about your discharge medications or the care you received while you were in the hospital after you are discharged, you can call the unit and ask to speak with the hospitalist on call if the hospitalist that took care of you is not available. Once you are discharged, your primary care physician will handle any further medical issues. Please note that NO REFILLS for any discharge medications will be authorized once you are discharged, as it is imperative that you return to your primary care physician (or establish a relationship with a primary care physician if you do not have one) for your aftercare needs so that they can reassess your need for medications and monitor your lab values.  Follow up with PCP and General Surgery. Take all medications as prescribed. If symptoms change or worsen please return to the ED for evaluation   Increase activity slowly   Complete by:  As directed      Allergies as of 01/06/2019      Reactions   Norvasc [amlodipine Besylate] Other (See Comments)   drowsiness   Penicillins    Nausea Did it involve swelling of the face/tongue/throat, SOB, or low BP? no Did it involve sudden or severe rash/hives, skin peeling, or any reaction on the inside of your mouth or nose? no Did you need to seek medical attention at a hospital or doctor's office? no When did it last happen?2012 If all above answers are "NO", may proceed with cephalosporin use.   Tylox [oxycodone-acetaminophen]    Migraines      Medication List     TAKE these medications   allopurinol 300 MG tablet Commonly known as:  ZYLOPRIM Take 300 mg by mouth daily.   cetirizine 10 MG chewable tablet Commonly known as:  ZYRTEC Chew 10 mg by mouth daily.   dicyclomine 20 MG tablet Commonly known as:  BENTYL Take 1 tablet (20 mg total) by mouth 2 (two) times daily.   hydrochlorothiazide 25 MG tablet Commonly known as:  HYDRODIURIL Take 25 mg by mouth daily.   HYDROcodone-acetaminophen 7.5-325 MG tablet Commonly known as:  NORCO Take 1 tablet by mouth every 4 (four) hours as needed for moderate pain.   lip balm ointment Apply 1 application topically 2 (two) times daily.  multivitamin tablet Take 1 tablet by mouth daily.   nitrofurantoin (macrocrystal-monohydrate) 100 MG capsule Commonly known as:  MACROBID Take 1 capsule (100 mg total) by mouth every 12 (twelve) hours for 9 doses.   olmesartan 40 MG tablet Commonly known as:  BENICAR Take 40 mg by mouth daily.   ondansetron 4 MG disintegrating tablet Commonly known as:  Zofran ODT Take 1 tablet (4 mg total) by mouth every 8 (eight) hours as needed for nausea or vomiting.      Follow-up Information    Coralie Keens, MD. Go on 01/26/2019.   Specialty:  General Surgery Why:  at 9:10am. Please arrive 20 min prior to complete paperwork. Please bring photo ID and insurance card Contact information: [address] [place] [phone] [phone]        [facility], [place]. Go on 01/12/2019.   Specialty:  General Surgery Why:  at 2:00pm for staple removal and our office will complete your FMLA paperwork. Please arrive 20 min prior to complete paperwork. Please bring photo ID and insurance card Contact information: [place] [phone]         Allergies  Allergen Reactions  . Norvasc [Amlodipine Besylate] Other (See Comments)    drowsiness  . Penicillins     Nausea Did it  involve swelling of the face/tongue/throat, SOB, or low BP? no Did it involve sudden or severe rash/hives, skin peeling, or any reaction on the inside of your mouth or nose? no Did you need to seek medical attention at a hospital or doctor's office? no When did it last happen?2012 If all above answers are "NO", may proceed with cephalosporin use.   Roselee Culver [Oxycodone-Acetaminophen]     Migraines   Consultations:  General Surgery  Procedures/Studies: Ct Abdomen Pelvis Wo Contrast  Result Date: 12/25/2018 CLINICAL DATA:  Abdominal pain, gastroenteritis or colitis suspected EXAM: CT ABDOMEN AND PELVIS WITHOUT CONTRAST TECHNIQUE: Multidetector CT imaging of the abdomen and pelvis was performed following the standard protocol without IV contrast. Oral enteric contrast was administered. COMPARISON:  None. FINDINGS: Lower chest: Small right pleural effusion with extensive bibasilar scarring and/or atelectasis. Hepatobiliary: No focal liver abnormality is seen. No gallstones, gallbladder wall thickening, or biliary dilatation. Pancreas: Unremarkable. No pancreatic ductal dilatation or surrounding inflammatory changes. Spleen: Normal in size without focal abnormality. Adrenals/Urinary Tract: Adrenal glands are unremarkable. Kidneys are normal, without renal calculi, focal lesion, or hydronephrosis. Bladder is unremarkable. Stomach/Bowel: Stomach is within normal limits. Appendix appears normal. There are numerous loops of distended small bowel in the mid abdomen, largest loops measuring up to 4.0 cm. The distal small bowel is decompressed. Transition point is difficult to identify although suspect it is in the vicinity of the left adnexa (series 2, image 72, series 4, image 45). Vascular/Lymphatic: No significant vascular findings are present. No enlarged abdominal or pelvic lymph nodes. Reproductive: Uterine fibroids. Other: No abdominal wall hernia or abnormality. Small volume perihepatic ascites.  Musculoskeletal: No acute or significant osseous findings. Bilateral pars defects of L5. IMPRESSION: 1. There are numerous loops of distended small bowel in the mid abdomen, largest loops measuring up to 4.0 cm. The distal small bowel is decompressed. Findings are consistent with small bowel obstruction. Transition point is difficult to identify although suspect it is in the vicinity of the left adnexa (series 2, image 72, series 4, image 45). 2.  Small volume perihepatic ascites, of uncertain etiology. 3.  Small right  pleural effusion. 4.  Fibroids. Electronically Signed   By: Eddie Candle M.D.   On: 12/25/2018 14:44   Dg Abd 1 View  Result Date: 12/25/2018 CLINICAL DATA:  NG tube placement EXAM: ABDOMEN - 1 VIEW COMPARISON:  None. FINDINGS: NG tube tip and side port project over the stomach. Dilated small bowel in the right lower quadrant. Lung bases are clear. IMPRESSION: NG-tube tip and side port project over the stomach. Electronically Signed   By: Ulyses Jarred M.D.   On: 12/25/2018 19:40   Dg Abd 2 Views  Result Date: 12/27/2018 CLINICAL DATA:  Small bowel obstruction. EXAM: ABDOMEN - 2 VIEW COMPARISON:  Dec 26, 2018 FINDINGS: Continued small bowel obstruction with dilated loops of small bowel, some of which demonstrate air-fluid levels. The distal tip of the NG tube is in the fundus with the side port just below the GE junction. IMPRESSION: 1. Continued small bowel obstruction. 2. The side port of the NG tube is just below the GE junction with the distal tip in the fundus. Electronically Signed   By: Dorise Bullion III M.D   On: 12/27/2018 21:10   Dg Abd Portable 1v  Result Date: 12/30/2018 CLINICAL DATA:  Follow-up small bowel obstruction EXAM: PORTABLE ABDOMEN - 1 VIEW COMPARISON:  Yesterday FINDINGS: Continued prominent small bowel distension with loops measuring up to 5.7 cm diameter. The degree of distention has increased. No concerning mass effect or gas collection. IMPRESSION: Small bowel  obstruction with progressive distention. Electronically Signed   By: Monte Fantasia M.D.   On: 12/30/2018 04:47   Dg Abd Portable 1v  Result Date: 12/29/2018 CLINICAL DATA:  Small bowel obstruction. EXAM: PORTABLE ABDOMEN - 1 VIEW COMPARISON:  Abdominal x-ray dated Dec 27, 2018. FINDINGS: Interval removal of the enteric tube. Persistent small bowel dilatation in the central abdomen. No acute osseous abnormality. IMPRESSION: Unchanged small bowel obstruction. Electronically Signed   By: Titus Dubin M.D.   On: 12/29/2018 09:27   Dg Abd Portable 1v-small Bowel Obstruction Protocol-initial, 8 Hr Delay  Result Date: 12/26/2018 CLINICAL DATA:  Small bowel obstruction EXAM: PORTABLE ABDOMEN - 1 VIEW COMPARISON:  12/26/2018 FINDINGS: Continued dilated small bowel loops in the abdomen compatible with small bowel obstruction. Degree of distention may have increased since prior study. NG tube tip is seen in the proximal stomach, unchanged. IMPRESSION: Continued small bowel dilatation, slightly worsened since prior study compatible with small bowel obstruction. Electronically Signed   By: Rolm Baptise M.D.   On: 12/26/2018 23:45   Dg Abd Portable 1v  Result Date: 12/26/2018 CLINICAL DATA:  Small bowel obstruction. EXAM: PORTABLE ABDOMEN - 1 VIEW COMPARISON:  Abdominal x-ray from yesterday. FINDINGS: NG tube tip in the stomach. Mildly dilated loops of small bowel in the central and right abdomen are unchanged. No acute osseous abnormality. IMPRESSION: 1. Unchanged small bowel obstruction. Electronically Signed   By: Titus Dubin M.D.   On: 12/26/2018 10:27   [place] Ekg Site Rite  Result Date: 01/02/2019 If Site Rite image not attached, placement could not be confirmed due to current cardiac rhythm.  US Abdomen Limited Ruq  Result Date: 01/03/2019 CLINICAL DATA:  Abnormal LFTs. EXAM: ULTRASOUND ABDOMEN LIMITED RIGHT UPPER QUADRANT COMPARISON:  None. FINDINGS: Gallbladder: No gallstones or wall thickening  visualized. No sonographic Murphy sign noted by sonographer. Common bile duct: Diameter: Normal at 3.4 cm Liver: No focal lesion identified. Within normal limits in parenchymal echogenicity. Portal vein is patent on color Doppler imaging with normal direction  of blood flow towards the liver. IMPRESSION: No acute findings in the RIGHT upper quadrant ultrasound. Electronically Signed   By: Suzy Bouchard M.D.   On: 01/03/2019 09:49    Subjective: Seen and examined at bedside was doing better.  Chest pain, lightheadedness or dizziness.  Tolerating diet.  Still having some urinary frequency.  No other concerns complaints at this time and discussed with her about-ESBL E. coli UTI and she was surprised.  Will treat with Macrobid and she is understandable agreeable to plan of care and she is deemed stable for discharge and will need to follow-up with PCP and general surgery.  Discharge Exam: Vitals:   01/05/19 2111 01/06/19 0604  BP: (!) 152/92 (!) 154/88  Pulse: 90 92  Resp: 18 14  Temp: 98.1 F (36.7 C) 98.6 F (37 C)  SpO2: 99% 97%   Vitals:   01/05/19 0553 01/05/19 1336 01/05/19 2111 01/06/19 0604  BP: (!) 157/92 (!) 155/95 (!) 152/92 (!) 154/88  Pulse: 90 94 90 92  Resp: 20 18 18 14   Temp: 98.6 F (37 C) 98.4 F (36.9 C) 98.1 F (36.7 C) 98.6 F (37 C)  TempSrc: Oral Oral Oral Oral  SpO2: 96%  99% 97%  Weight:      Height:       General: Pt is alert, awake, not in acute distress Cardiovascular: RRR, S1/S2 +, no rubs, no gallops Respiratory: Diminished bilaterally, no wheezing, no rhonchi Abdominal: Soft, NT, Distended 2/2 body habitus, bowel sounds + Extremities: Trace edema, no cyanosis  The results of significant diagnostics from this hospitalization (including imaging, microbiology, ancillary and laboratory) are listed below for reference.    Microbiology: Recent Results (from the past 240 hour(s))  MRSA PCR Screening     Status: None   Collection Time: 12/30/18  8:56 AM   Result Value Ref Range Status   MRSA by PCR NEGATIVE NEGATIVE Final    Comment:        The GeneXpert MRSA Assay (FDA approved for NASAL specimens only), is one component of a comprehensive MRSA colonization surveillance program. It is not intended to diagnose MRSA infection nor to guide or monitor treatment for MRSA infections. Performed at [facility], [place] [address], [place], [place] [address]   Culture, Urine     Status: Abnormal   Collection Time: 01/04/19  7:53 AM  Result Value Ref Range Status   Specimen Description   Final    URINE, CLEAN CATCH Performed at [facility], [place] [address], [place], [place] [address]    Special Requests   Final    NONE Performed at [facility], [place] [address] [place], [place] [address]    Culture (A)  Final    >=100,000 COLONIES/mL ESCHERICHIA COLI Confirmed Extended Spectrum Beta-Lactamase Producer (ESBL).  In bloodstream infections from ESBL organisms, carbapenems are preferred over piperacillin/tazobactam. They are shown to have a lower risk of mortality.    Report Status 01/06/2019 FINAL  Final   Organism ID, Bacteria ESCHERICHIA COLI (A)  Final      Susceptibility   Escherichia coli - MIC*    AMPICILLIN >=32 RESISTANT Resistant     CEFAZOLIN >=64 RESISTANT Resistant     CEFTRIAXONE >=64 RESISTANT Resistant     CIPROFLOXACIN >=4 RESISTANT Resistant     GENTAMICIN <=1 SENSITIVE Sensitive     IMIPENEM <=0.25 SENSITIVE Sensitive     NITROFURANTOIN <=16 SENSITIVE Sensitive     TRIMETH/SULFA >=320 RESISTANT Resistant  AMPICILLIN/SULBACTAM <=2 SENSITIVE Sensitive     PIP/TAZO <=4 SENSITIVE Sensitive     Extended ESBL POSITIVE Resistant     * >=100,000 COLONIES/mL ESCHERICHIA COLI    Labs: BNP (last 3 results) No results for input(s): BNP in the last 8760 hours. Basic Metabolic Panel: Recent Labs  Lab 01/02/19 0453 01/03/19 0439 01/04/19 0442 01/05/19 0351  01/05/19 1328 01/06/19 0442  NA 139 139 138 137 136 135  K 3.3* 3.2* 3.9 4.4 4.5 4.5  CL 105 100 102 103 102 103  CO2 29 29 27 25 23 23   GLUCOSE 104* 126* 132* 128* 121* 134*  BUN 11 12 16 16 17 19   CREATININE 1.01* 1.03* 1.04* 1.01* 0.96 0.99  CALCIUM 8.7* 9.0 9.0 9.2 9.2 9.2  MG 2.1 2.0 2.0 1.9  --  2.2  PHOS 3.3 3.3 3.6 4.1  --  4.5   Liver Function Tests: Recent Labs  Lab 01/03/19 0439 01/04/19 0442 01/05/19 0351 01/05/19 1328 01/06/19 0442  AST 64* 62* 63* 75* 75*  ALT 45* 51* 56* 60* 67*  ALKPHOS 170* 176* 208* 249* 263*  BILITOT 1.3* 0.8 0.8 0.7 0.6  PROT 5.8* 6.1* 6.2* 6.5 6.5  ALBUMIN 3.0* 3.1* 3.1* 3.3* 3.3*   No results for input(s): LIPASE, AMYLASE in the last 168 hours. No results for input(s): AMMONIA in the last 168 hours. CBC: Recent Labs  Lab 01/02/19 0453 01/03/19 0439 01/04/19 0442 01/05/19 0351 01/06/19 0442  WBC 6.4 6.8 9.6 10.9* 9.7  NEUTROABS 4.4 5.1 7.4 8.5* 7.1  HGB 9.5* 9.6* 9.8* 9.4* 9.5*  HCT 31.3* 30.7* 30.8* 29.5* 30.6*  MCV 97.8 96.5 96.3 96.4 97.1  PLT 234 231 246 243 273   Cardiac Enzymes: No results for input(s): CKTOTAL, CKMB, CKMBINDEX, TROPONINI in the last 168 hours. BNP: Invalid input(s): POCBNP CBG: Recent Labs  Lab 01/03/19 0505 01/03/19 1129 01/03/19 1806 01/03/19 2349 01/04/19 0514  GLUCAP 116* 105* 86 148* 123*   D-Dimer No results for input(s): DDIMER in the last 72 hours. Hgb A1c No results for input(s): HGBA1C in the last 72 hours. Lipid Profile Recent Labs    01/05/19 0351 01/05/19 1032  TRIG 232* 247*   Thyroid function studies No results for input(s): TSH, T4TOTAL, T3FREE, THYROIDAB in the last 72 hours.  Invalid input(s): FREET3 Anemia work up No results for input(s): VITAMINB12, FOLATE, FERRITIN, TIBC, IRON, RETICCTPCT in the last 72 hours. Urinalysis    Component Value Date/Time   COLORURINE YELLOW 01/04/2019 0753   APPEARANCEUR HAZY (A) 01/04/2019 0753   LABSPEC 1.012 01/04/2019  0753   PHURINE 9.0 (H) 01/04/2019 0753   GLUCOSEU NEGATIVE 01/04/2019 0753   HGBUR NEGATIVE 01/04/2019 0753   BILIRUBINUR NEGATIVE 01/04/2019 0753   KETONESUR NEGATIVE 01/04/2019 0753   PROTEINUR NEGATIVE 01/04/2019 0753   NITRITE NEGATIVE 01/04/2019 0753   LEUKOCYTESUR SMALL (A) 01/04/2019 0753   Sepsis Labs Invalid input(s): PROCALCITONIN,  WBC,  LACTICIDVEN Microbiology Recent Results (from the past 240 hour(s))  MRSA PCR Screening     Status: None   Collection Time: 12/30/18  8:56 AM  Result Value Ref Range Status   MRSA by PCR NEGATIVE NEGATIVE Final    Comment:        The GeneXpert MRSA Assay (FDA approved for NASAL specimens only), is one component of a comprehensive MRSA colonization surveillance program. It is not intended to diagnose MRSA infection nor to guide or monitor treatment for MRSA infections. Performed at [facility], [place]  [address] [place], [place] [address]   Culture, Urine     Status: Abnormal   Collection Time: 01/04/19  7:53 AM  Result Value Ref Range Status   Specimen Description   Final    URINE, CLEAN CATCH Performed at [facility], [place] [address] [place], [place] [address]    Special Requests   Final    NONE Performed at [facility], [place] [address], [place], [place] [address]    Culture (A)  Final    >=100,000 COLONIES/mL ESCHERICHIA COLI Confirmed Extended Spectrum Beta-Lactamase Producer (ESBL).  In bloodstream infections from ESBL organisms, carbapenems are preferred over piperacillin/tazobactam. They are shown to have a lower risk of mortality.    Report Status 01/06/2019 FINAL  Final   Organism ID, Bacteria ESCHERICHIA COLI (A)  Final      Susceptibility   Escherichia coli - MIC*    AMPICILLIN >=32 RESISTANT Resistant     CEFAZOLIN >=64 RESISTANT Resistant     CEFTRIAXONE >=64 RESISTANT Resistant     CIPROFLOXACIN >=4 RESISTANT Resistant     GENTAMICIN <=1 SENSITIVE  Sensitive     IMIPENEM <=0.25 SENSITIVE Sensitive     NITROFURANTOIN <=16 SENSITIVE Sensitive     TRIMETH/SULFA >=320 RESISTANT Resistant     AMPICILLIN/SULBACTAM <=2 SENSITIVE Sensitive     PIP/TAZO <=4 SENSITIVE Sensitive     Extended ESBL POSITIVE Resistant     * >=100,000 COLONIES/mL ESCHERICHIA COLI   Time coordinating discharge: 35 minutes  SIGNED:  Kerney Elbe, DO [facility] 01/06/2019, 11:40 AM Pager is on AMION  If 7PM-7AM, please contact night-coverage [email or web address] Password [email or web address]

## 2019-01-12 DIAGNOSIS — D649 Anemia, unspecified: Secondary | ICD-10-CM | POA: Diagnosis not present

## 2019-01-12 DIAGNOSIS — N179 Acute kidney failure, unspecified: Secondary | ICD-10-CM | POA: Diagnosis not present

## 2019-01-12 DIAGNOSIS — I10 Essential (primary) hypertension: Secondary | ICD-10-CM | POA: Diagnosis not present

## 2019-01-12 DIAGNOSIS — R5383 Other fatigue: Secondary | ICD-10-CM | POA: Diagnosis not present

## 2019-01-20 DIAGNOSIS — N179 Acute kidney failure, unspecified: Secondary | ICD-10-CM | POA: Diagnosis not present

## 2019-02-16 DIAGNOSIS — L659 Nonscarring hair loss, unspecified: Secondary | ICD-10-CM | POA: Diagnosis not present

## 2019-02-16 DIAGNOSIS — I10 Essential (primary) hypertension: Secondary | ICD-10-CM | POA: Diagnosis not present

## 2019-02-16 DIAGNOSIS — D649 Anemia, unspecified: Secondary | ICD-10-CM | POA: Diagnosis not present

## 2019-04-20 DIAGNOSIS — Z124 Encounter for screening for malignant neoplasm of cervix: Secondary | ICD-10-CM | POA: Diagnosis not present

## 2019-04-20 DIAGNOSIS — Z1151 Encounter for screening for human papillomavirus (HPV): Secondary | ICD-10-CM | POA: Diagnosis not present

## 2019-04-20 DIAGNOSIS — Z1231 Encounter for screening mammogram for malignant neoplasm of breast: Secondary | ICD-10-CM | POA: Diagnosis not present

## 2019-04-20 DIAGNOSIS — Z3689 Encounter for other specified antenatal screening: Secondary | ICD-10-CM | POA: Diagnosis not present

## 2019-04-20 DIAGNOSIS — Z6836 Body mass index (BMI) 36.0-36.9, adult: Secondary | ICD-10-CM | POA: Diagnosis not present

## 2019-04-20 DIAGNOSIS — Z01419 Encounter for gynecological examination (general) (routine) without abnormal findings: Secondary | ICD-10-CM | POA: Diagnosis not present

## 2019-05-13 DIAGNOSIS — M85852 Other specified disorders of bone density and structure, left thigh: Secondary | ICD-10-CM | POA: Diagnosis not present

## 2019-05-13 DIAGNOSIS — Z90710 Acquired absence of both cervix and uterus: Secondary | ICD-10-CM | POA: Diagnosis not present

## 2019-05-13 DIAGNOSIS — Z780 Asymptomatic menopausal state: Secondary | ICD-10-CM | POA: Diagnosis not present

## 2019-05-13 DIAGNOSIS — Z8262 Family history of osteoporosis: Secondary | ICD-10-CM | POA: Diagnosis not present

## 2019-05-25 DIAGNOSIS — M545 Low back pain: Secondary | ICD-10-CM | POA: Diagnosis not present

## 2019-06-05 DIAGNOSIS — M545 Low back pain: Secondary | ICD-10-CM | POA: Diagnosis not present

## 2019-06-09 DIAGNOSIS — M25552 Pain in left hip: Secondary | ICD-10-CM | POA: Diagnosis not present

## 2019-06-09 DIAGNOSIS — M545 Low back pain: Secondary | ICD-10-CM | POA: Diagnosis not present

## 2019-06-30 DIAGNOSIS — Z23 Encounter for immunization: Secondary | ICD-10-CM | POA: Diagnosis not present

## 2019-07-02 ENCOUNTER — Telehealth: Payer: Self-pay | Admitting: Diagnostic Neuroimaging

## 2019-07-02 NOTE — Telephone Encounter (Signed)
Returned to PCP. -VRP

## 2019-07-02 NOTE — Telephone Encounter (Signed)
Pt has called to report that [id number] suggestion made by Dr Leta Baptist, pt states she has reached out to a Dr Jaynie Collins at [phone] and was told that office will require a referral.  Pt is asking for a referral from Dr Leta Baptist

## 2019-07-02 NOTE — Telephone Encounter (Signed)
Attempted to reach patient to advise her per Dr Leta Baptist, to discuss rheumatology referral with her PCP. No answer and memory full, unable to LVM.

## 2019-07-06 NOTE — Telephone Encounter (Addendum)
Called mobile #, advised patient to contact PCP for rheumatology referral. Patient verbalized understanding, appreciation.

## 2019-07-08 DIAGNOSIS — M545 Low back pain: Secondary | ICD-10-CM | POA: Diagnosis not present

## 2019-07-13 DIAGNOSIS — M545 Low back pain: Secondary | ICD-10-CM | POA: Diagnosis not present

## 2019-07-14 DIAGNOSIS — M533 Sacrococcygeal disorders, not elsewhere classified: Secondary | ICD-10-CM | POA: Diagnosis not present

## 2019-07-20 DIAGNOSIS — I7300 Raynaud's syndrome without gangrene: Secondary | ICD-10-CM | POA: Diagnosis not present

## 2019-07-20 DIAGNOSIS — M545 Low back pain: Secondary | ICD-10-CM | POA: Diagnosis not present

## 2019-07-22 DIAGNOSIS — M545 Low back pain: Secondary | ICD-10-CM | POA: Diagnosis not present

## 2019-07-27 DIAGNOSIS — M545 Low back pain: Secondary | ICD-10-CM | POA: Diagnosis not present

## 2019-07-29 DIAGNOSIS — M545 Low back pain: Secondary | ICD-10-CM | POA: Diagnosis not present

## 2019-08-03 DIAGNOSIS — M533 Sacrococcygeal disorders, not elsewhere classified: Secondary | ICD-10-CM | POA: Diagnosis not present

## 2019-08-03 DIAGNOSIS — M545 Low back pain: Secondary | ICD-10-CM | POA: Diagnosis not present

## 2019-08-05 DIAGNOSIS — M545 Low back pain: Secondary | ICD-10-CM | POA: Diagnosis not present

## 2019-08-10 DIAGNOSIS — M545 Low back pain: Secondary | ICD-10-CM | POA: Diagnosis not present

## 2019-08-19 DIAGNOSIS — M545 Low back pain: Secondary | ICD-10-CM | POA: Diagnosis not present

## 2019-08-24 DIAGNOSIS — Z03818 Encounter for observation for suspected exposure to other biological agents ruled out: Secondary | ICD-10-CM | POA: Diagnosis not present

## 2019-08-24 DIAGNOSIS — M545 Low back pain: Secondary | ICD-10-CM | POA: Diagnosis not present

## 2019-08-26 DIAGNOSIS — M545 Low back pain: Secondary | ICD-10-CM | POA: Diagnosis not present

## 2019-11-28 DIAGNOSIS — Z20828 Contact with and (suspected) exposure to other viral communicable diseases: Secondary | ICD-10-CM | POA: Diagnosis not present

## 2020-03-21 DIAGNOSIS — Z0000 Encounter for general adult medical examination without abnormal findings: Secondary | ICD-10-CM | POA: Diagnosis not present

## 2020-03-21 DIAGNOSIS — Z79899 Other long term (current) drug therapy: Secondary | ICD-10-CM | POA: Diagnosis not present

## 2020-03-21 DIAGNOSIS — Z23 Encounter for immunization: Secondary | ICD-10-CM | POA: Diagnosis not present

## 2020-03-21 DIAGNOSIS — I10 Essential (primary) hypertension: Secondary | ICD-10-CM | POA: Diagnosis not present

## 2020-03-21 DIAGNOSIS — M109 Gout, unspecified: Secondary | ICD-10-CM | POA: Diagnosis not present

## 2020-03-21 IMAGING — CT CT ABDOMEN AND PELVIS WITHOUT CONTRAST
2 of 4 series · 16 of 46 positions shown, 18 images · non-contrast
Comparison: None.

CLINICAL DATA: Abdominal pain, gastroenteritis or colitis suspected

EXAM:
CT ABDOMEN AND PELVIS WITHOUT CONTRAST
TECHNIQUE: Multidetector CT imaging of the abdomen and pelvis was performed
following the standard protocol without IV contrast. Oral enteric
contrast was administered.

[Series 2: axial st · axial · 0.75mm/px · z∈[-438,-3]mm · 13 of 101 slices shown, 15 images]
[im 7/101  soft-tissue]
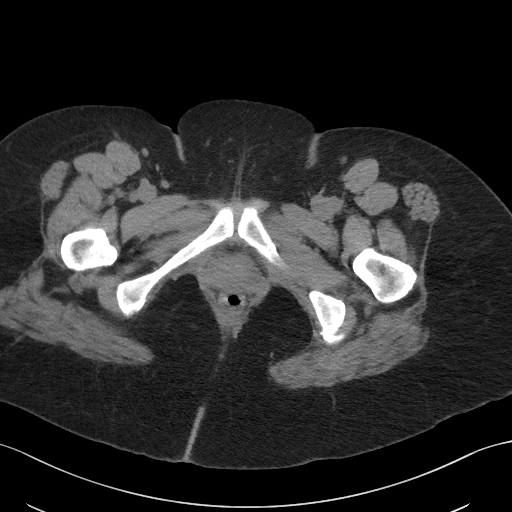
[im 7/101  bone]
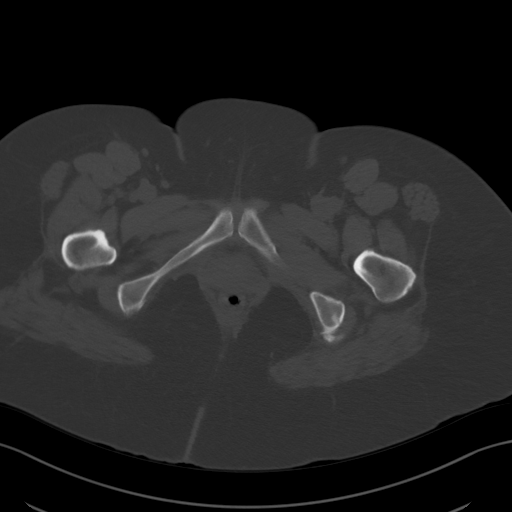
[im 13/101  soft-tissue]
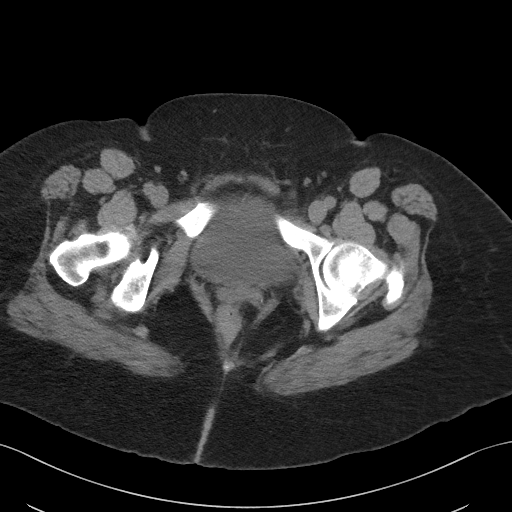
[im 19/101  soft-tissue]
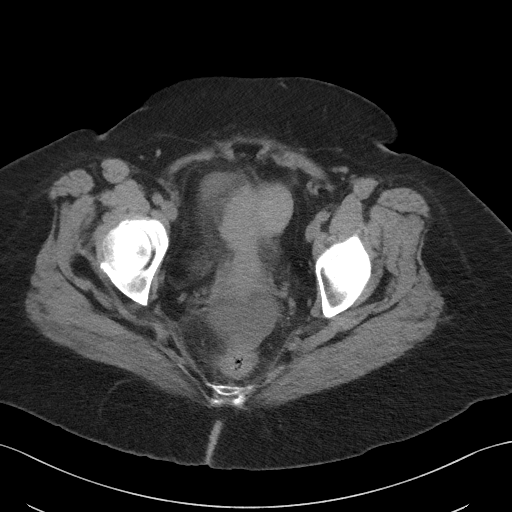
[im 32/101  soft-tissue]
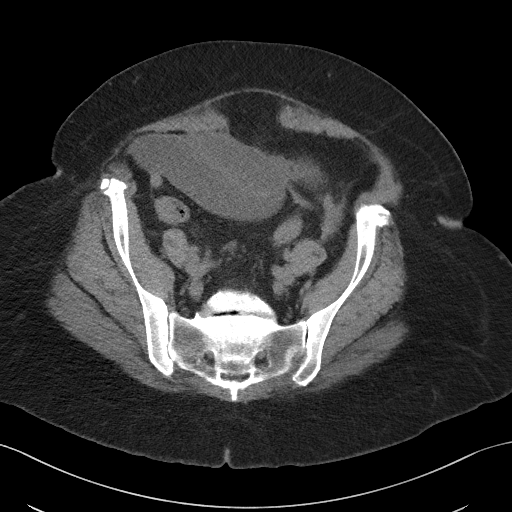
[im 38/101  soft-tissue]
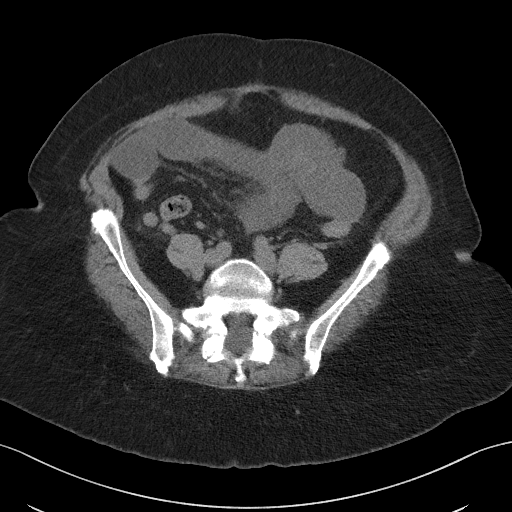
[im 44/101  soft-tissue]
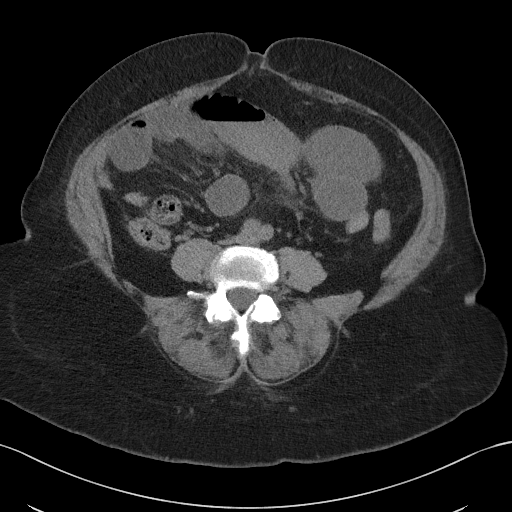
[im 51/101  soft-tissue]
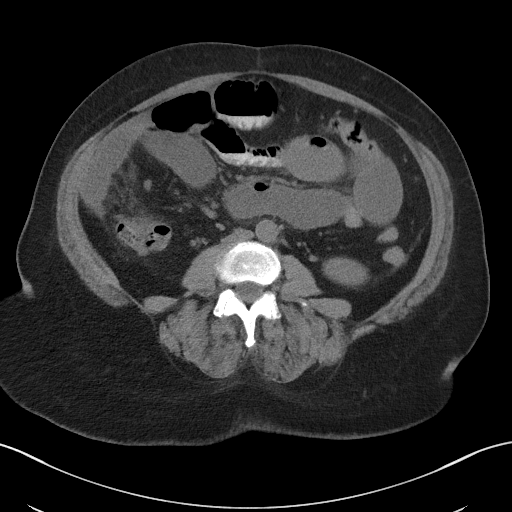
[im 57/101  soft-tissue]
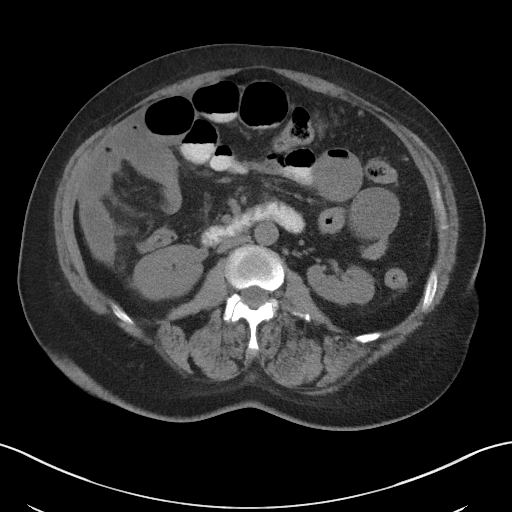
[im 63/101  soft-tissue]
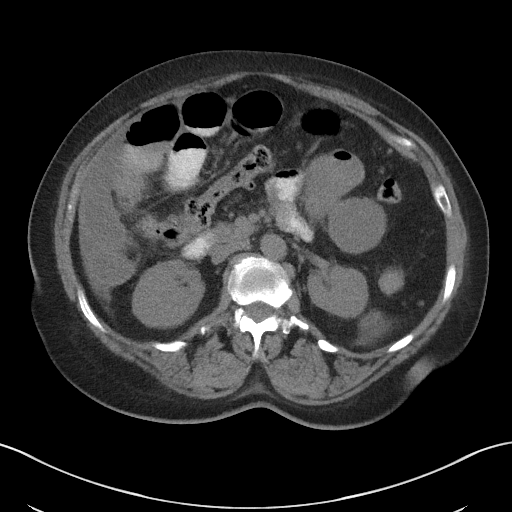
[im 63/101  bone]
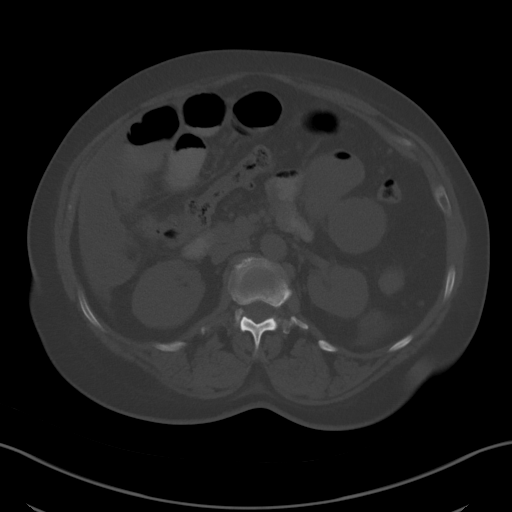
[im 69/101  soft-tissue]
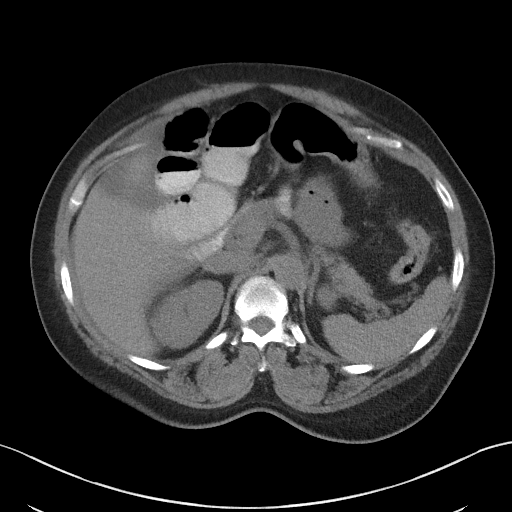
[im 82/101  soft-tissue]
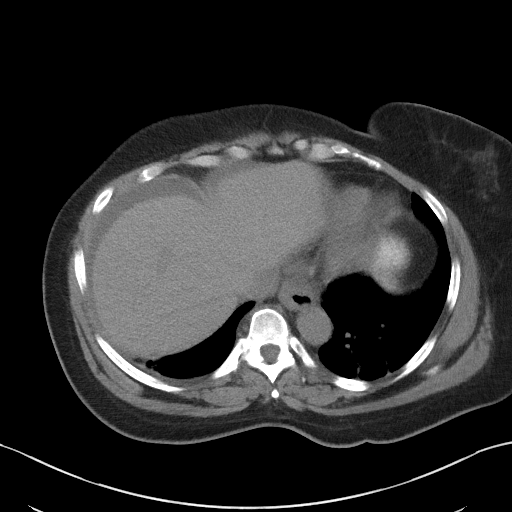
[im 88/101  soft-tissue]
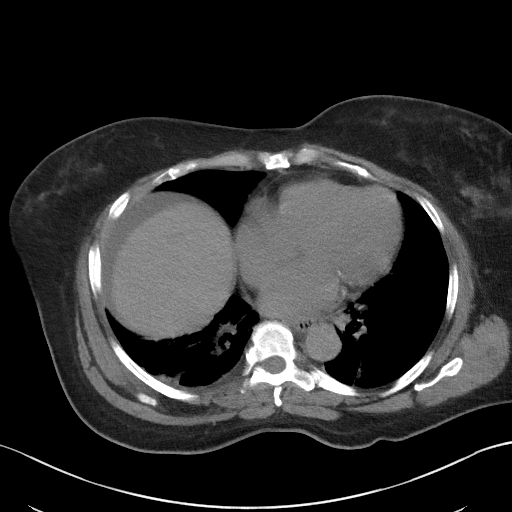
[im 94/101  soft-tissue]
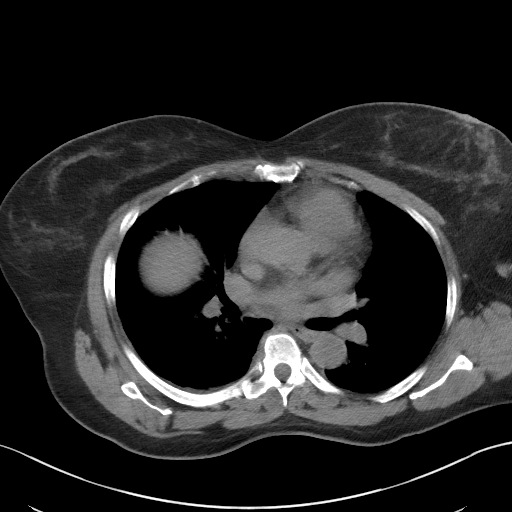

[Series 4: coronal st · coronal · 0.86mm/px · 3 of 110 slices shown]
[im 37/110  soft-tissue]
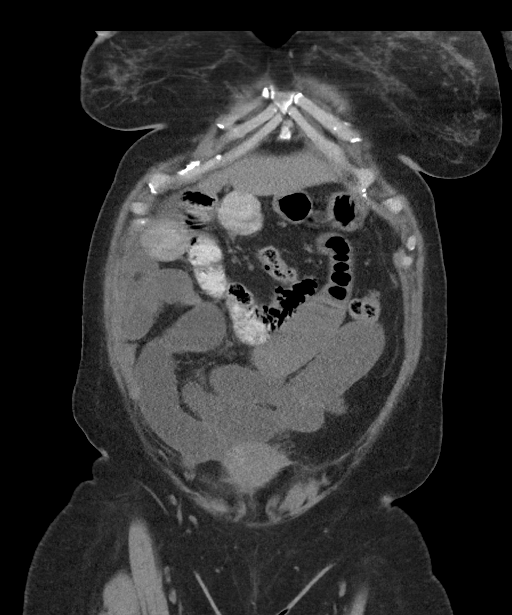
[im 49/110  soft-tissue]
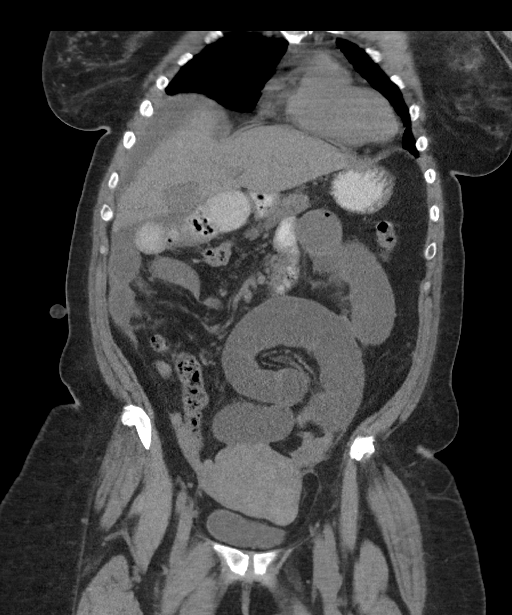
[im 61/110  soft-tissue]
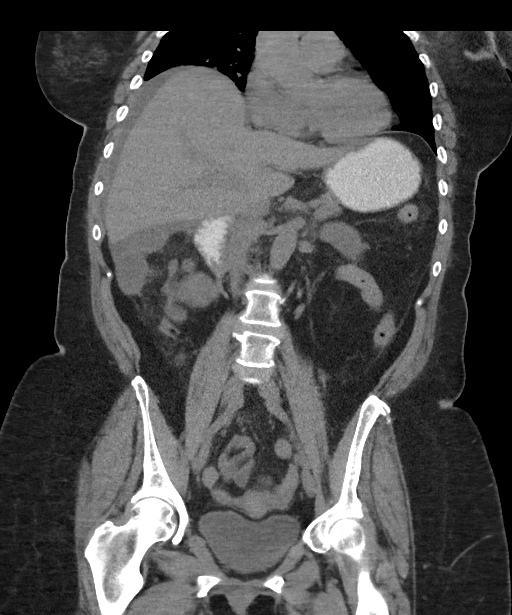

[16 of 46 positions shown; findings below may reference images not displayed]

FINDINGS: Lower chest: Small right pleural effusion with extensive bibasilar
scarring and/or atelectasis.

Hepatobiliary: No focal liver abnormality is seen. No gallstones,
gallbladder wall thickening, or biliary dilatation.

Pancreas: Unremarkable. No pancreatic ductal dilatation or
surrounding inflammatory changes.

Spleen: Normal in size without focal abnormality.

Adrenals/Urinary Tract: Adrenal glands are unremarkable. Kidneys are
normal, without renal calculi, focal lesion, or hydronephrosis.
Bladder is unremarkable.

Stomach/Bowel: Stomach is within normal limits. Appendix appears
normal. There are numerous loops of distended small bowel in the mid
abdomen, largest loops measuring up to 4.0 cm. The distal small
bowel is decompressed. Transition point is difficult to identify
although suspect it is in the vicinity of the left adnexa (series 2,
image 72, series 4, image 45).

Vascular/Lymphatic: No significant vascular findings are present. No
enlarged abdominal or pelvic lymph nodes.

Reproductive: Uterine fibroids.

Other: No abdominal wall hernia or abnormality. Small volume
perihepatic ascites.

Musculoskeletal: No acute or significant osseous findings. Bilateral
pars defects of L5.
IMPRESSION: 1. There are numerous loops of distended small bowel in the mid
abdomen, largest loops measuring up to 4.0 cm. The distal small
bowel is decompressed. Findings are consistent with small bowel
obstruction. Transition point is difficult to identify although
suspect it is in the vicinity of the left adnexa (series 2, image
72, series 4, image 45).

2.  Small volume perihepatic ascites, of uncertain etiology.

3.  Small right pleural effusion.

4.  Fibroids.

## 2020-03-22 IMAGING — DX PORTABLE ABDOMEN - 1 VIEW
1 series · 1 of 1 positions shown · non-contrast
Comparison: Abdominal x-ray from yesterday.

CLINICAL DATA: Small bowel obstruction.

EXAM:
PORTABLE ABDOMEN - 1 VIEW

[abdomen kub]
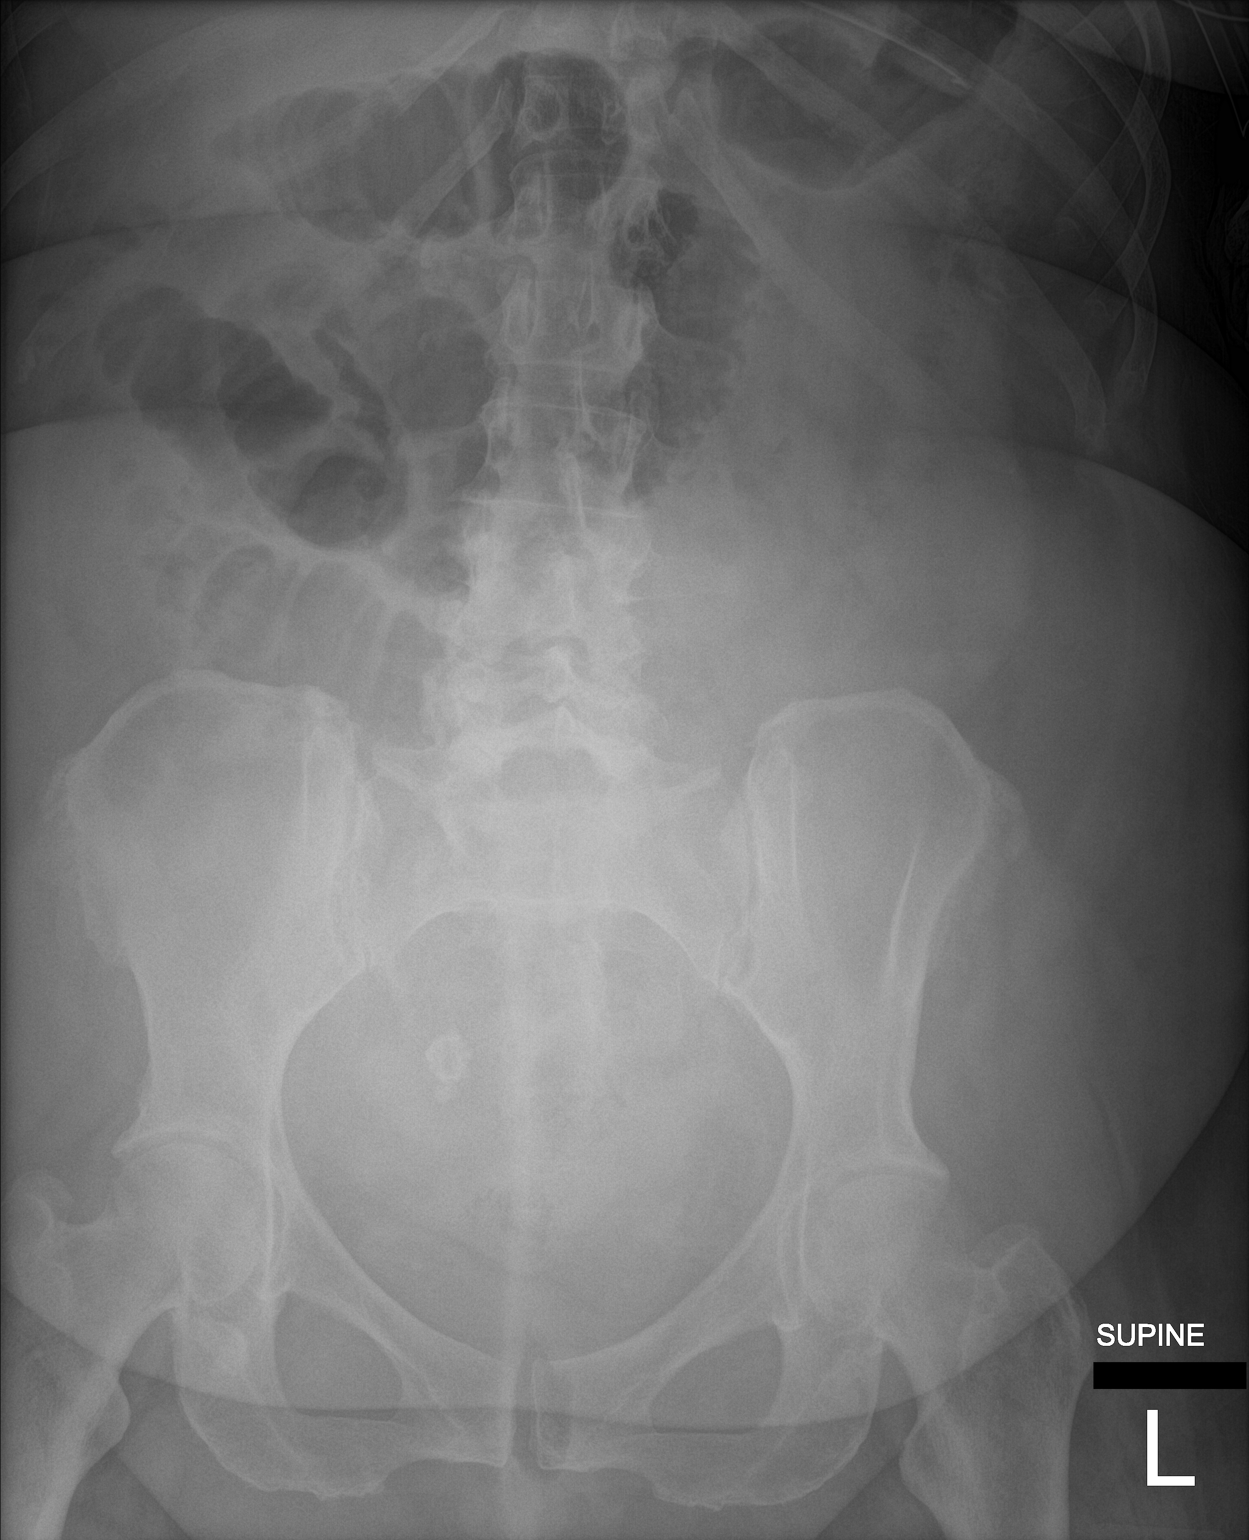

[1 of 1 positions shown; findings below may reference images not displayed]

FINDINGS: NG tube tip in the stomach. Mildly dilated loops of small bowel in
the central and right abdomen are unchanged. No acute osseous
abnormality.
IMPRESSION: 1. Unchanged small bowel obstruction.

## 2020-03-23 IMAGING — DX ABDOMEN - 2 VIEW
2 series · 2 of 2 positions shown · non-contrast
Comparison: December 26, 2018

CLINICAL DATA: Small bowel obstruction.

EXAM:
ABDOMEN - 2 VIEW

[abdomen erect]
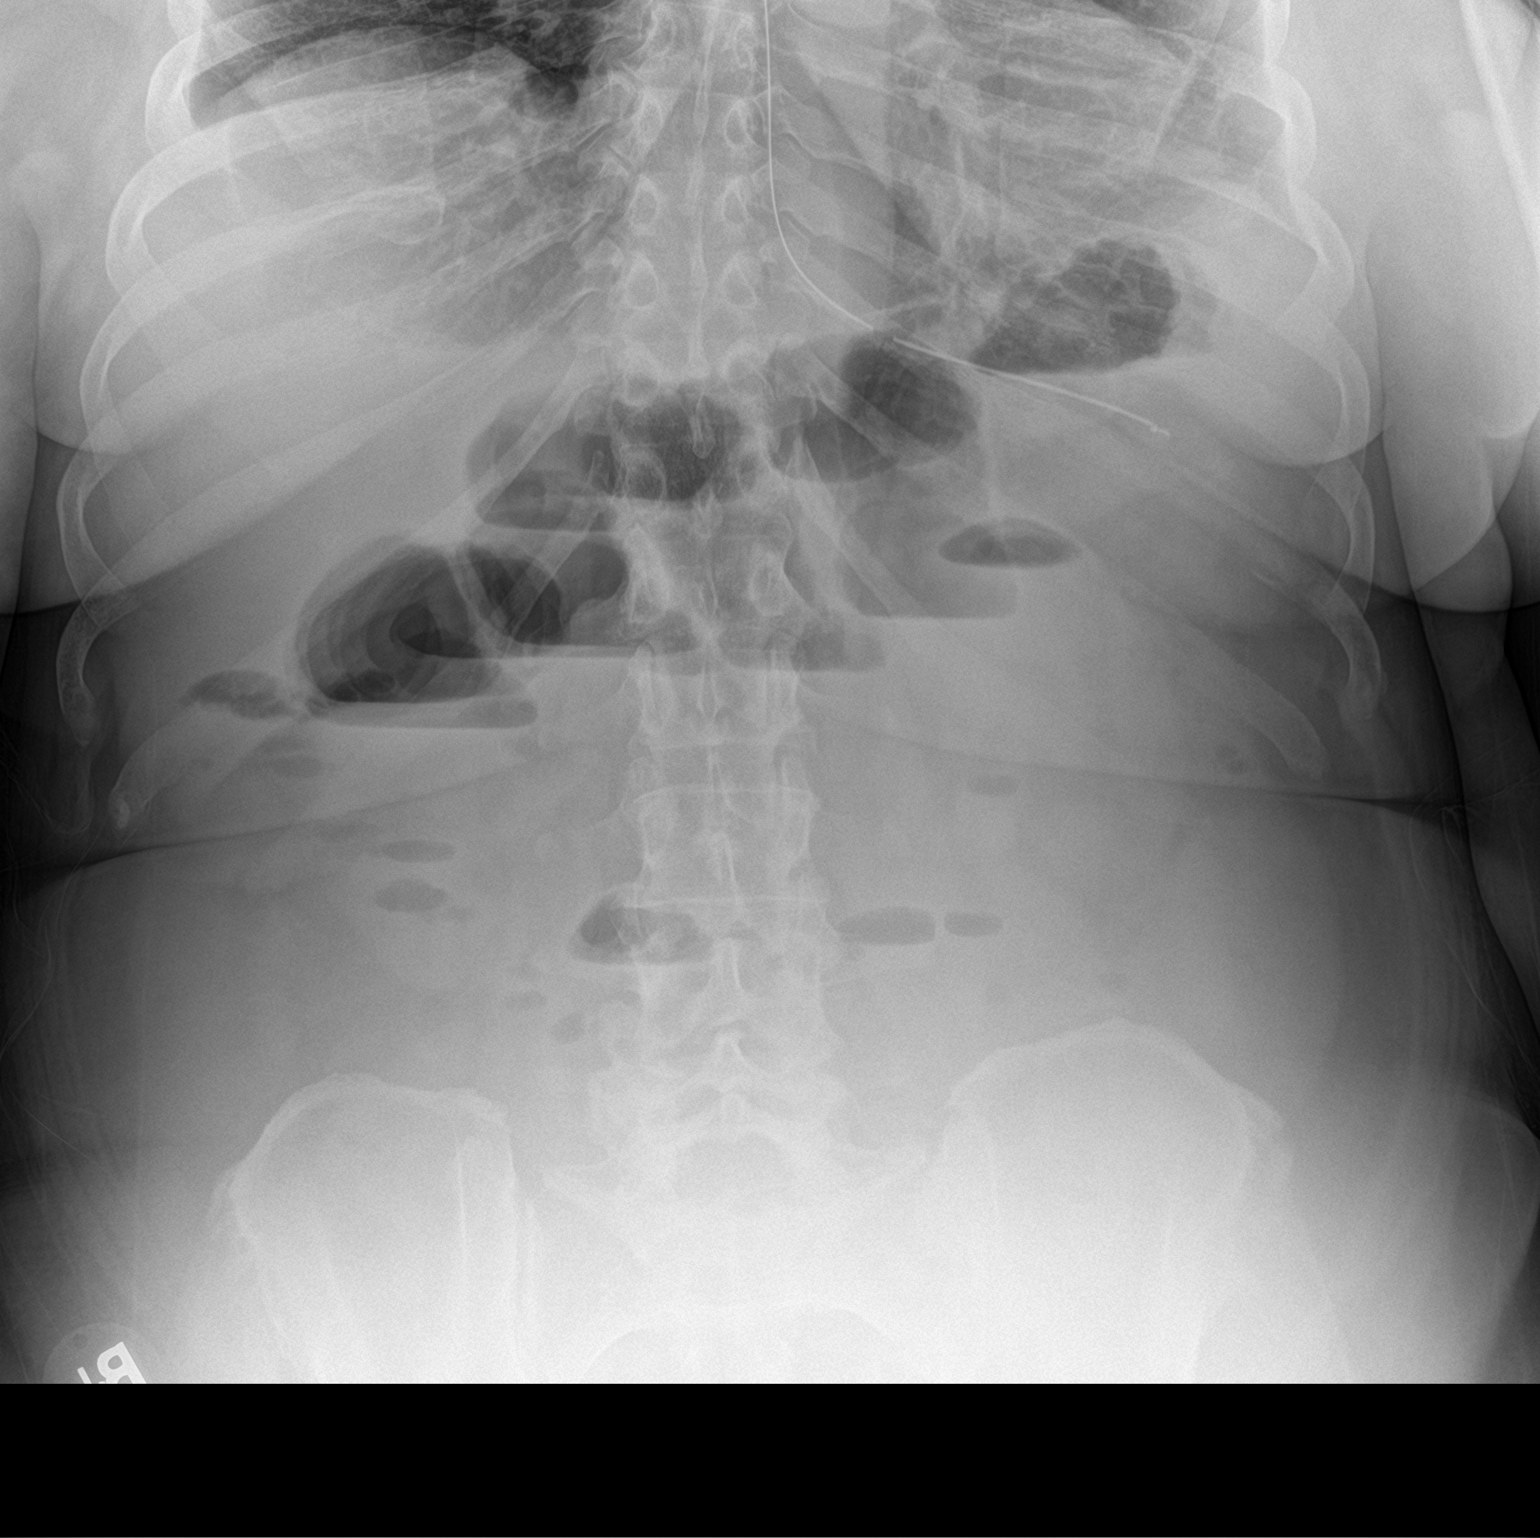

[abdomen supine]
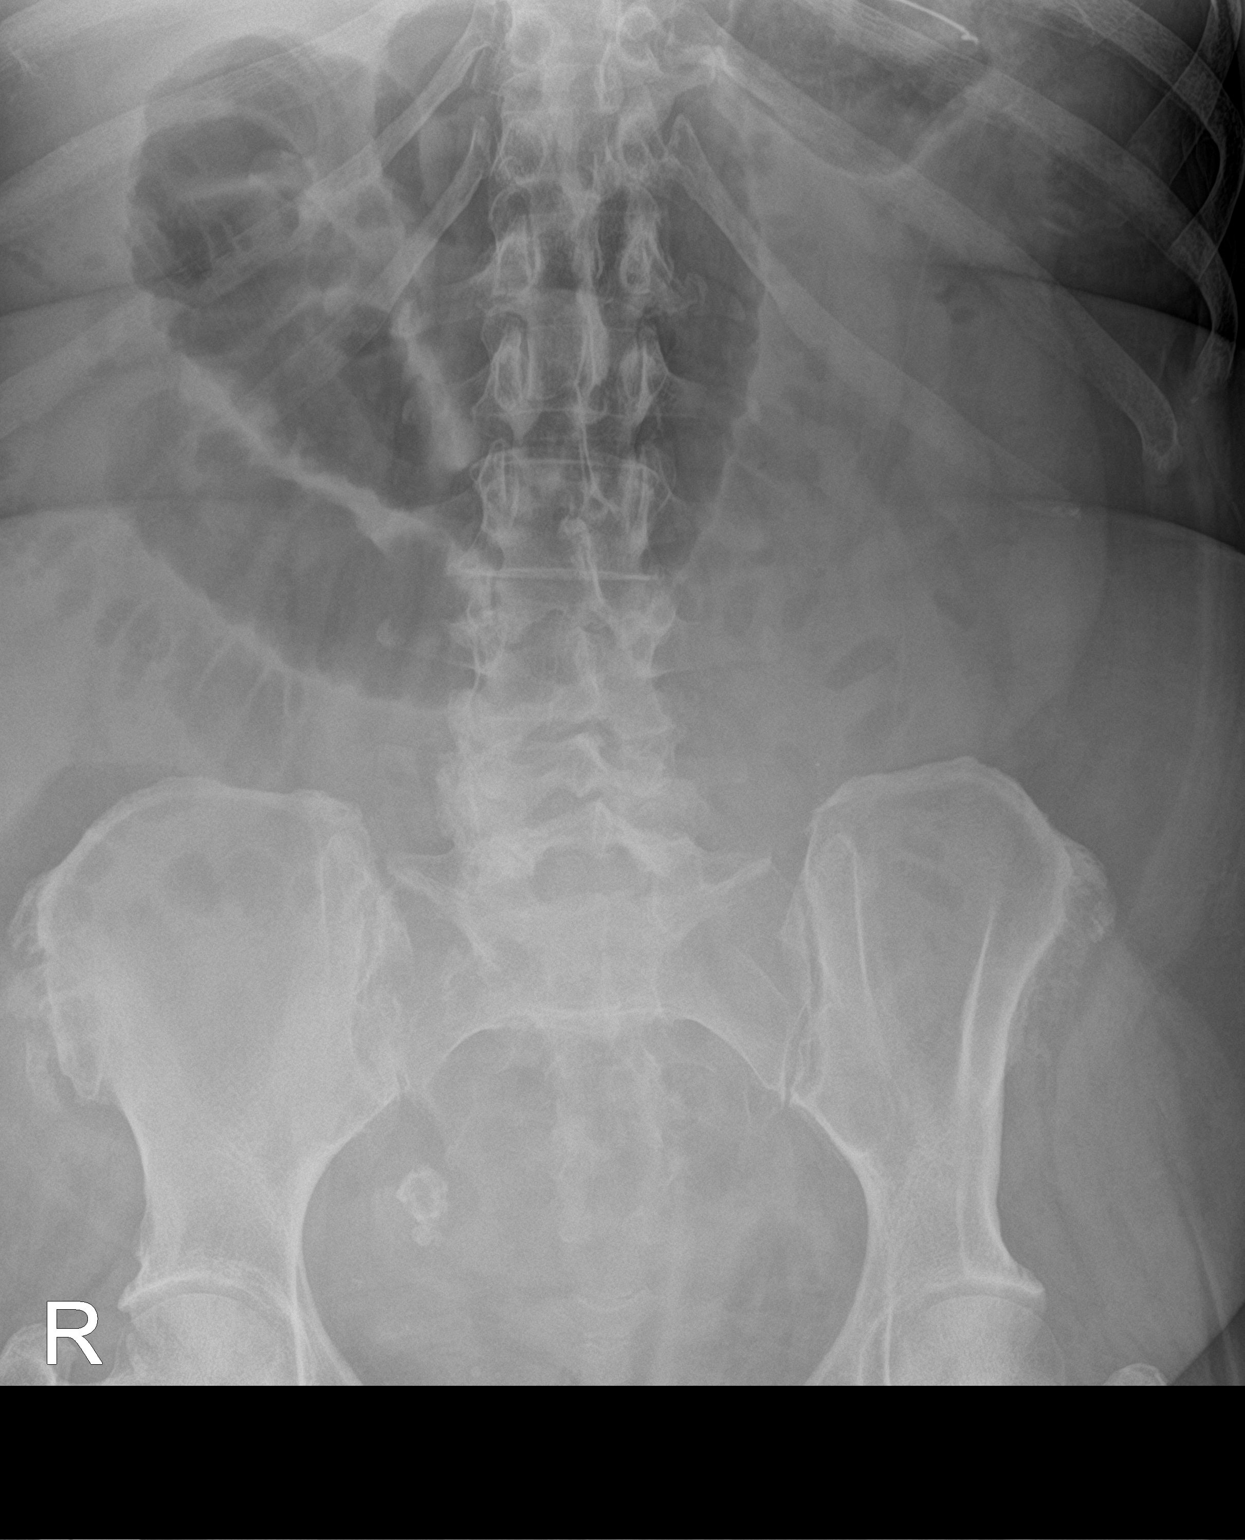

[2 of 2 positions shown; findings below may reference images not displayed]

FINDINGS: Continued small bowel obstruction with dilated loops of small bowel,
some of which demonstrate air-fluid levels. The distal tip of the NG
tube is in the fundus with the side port just below the GE junction.
IMPRESSION: 1. Continued small bowel obstruction.
2. The side port of the NG tube is just below the GE junction with
the distal tip in the fundus.

## 2020-03-26 IMAGING — DX PORTABLE ABDOMEN - 1 VIEW
1 series · 1 of 1 positions shown · non-contrast
Comparison: Yesterday

CLINICAL DATA: Follow-up small bowel obstruction

EXAM:
PORTABLE ABDOMEN - 1 VIEW

[abdomen kub]
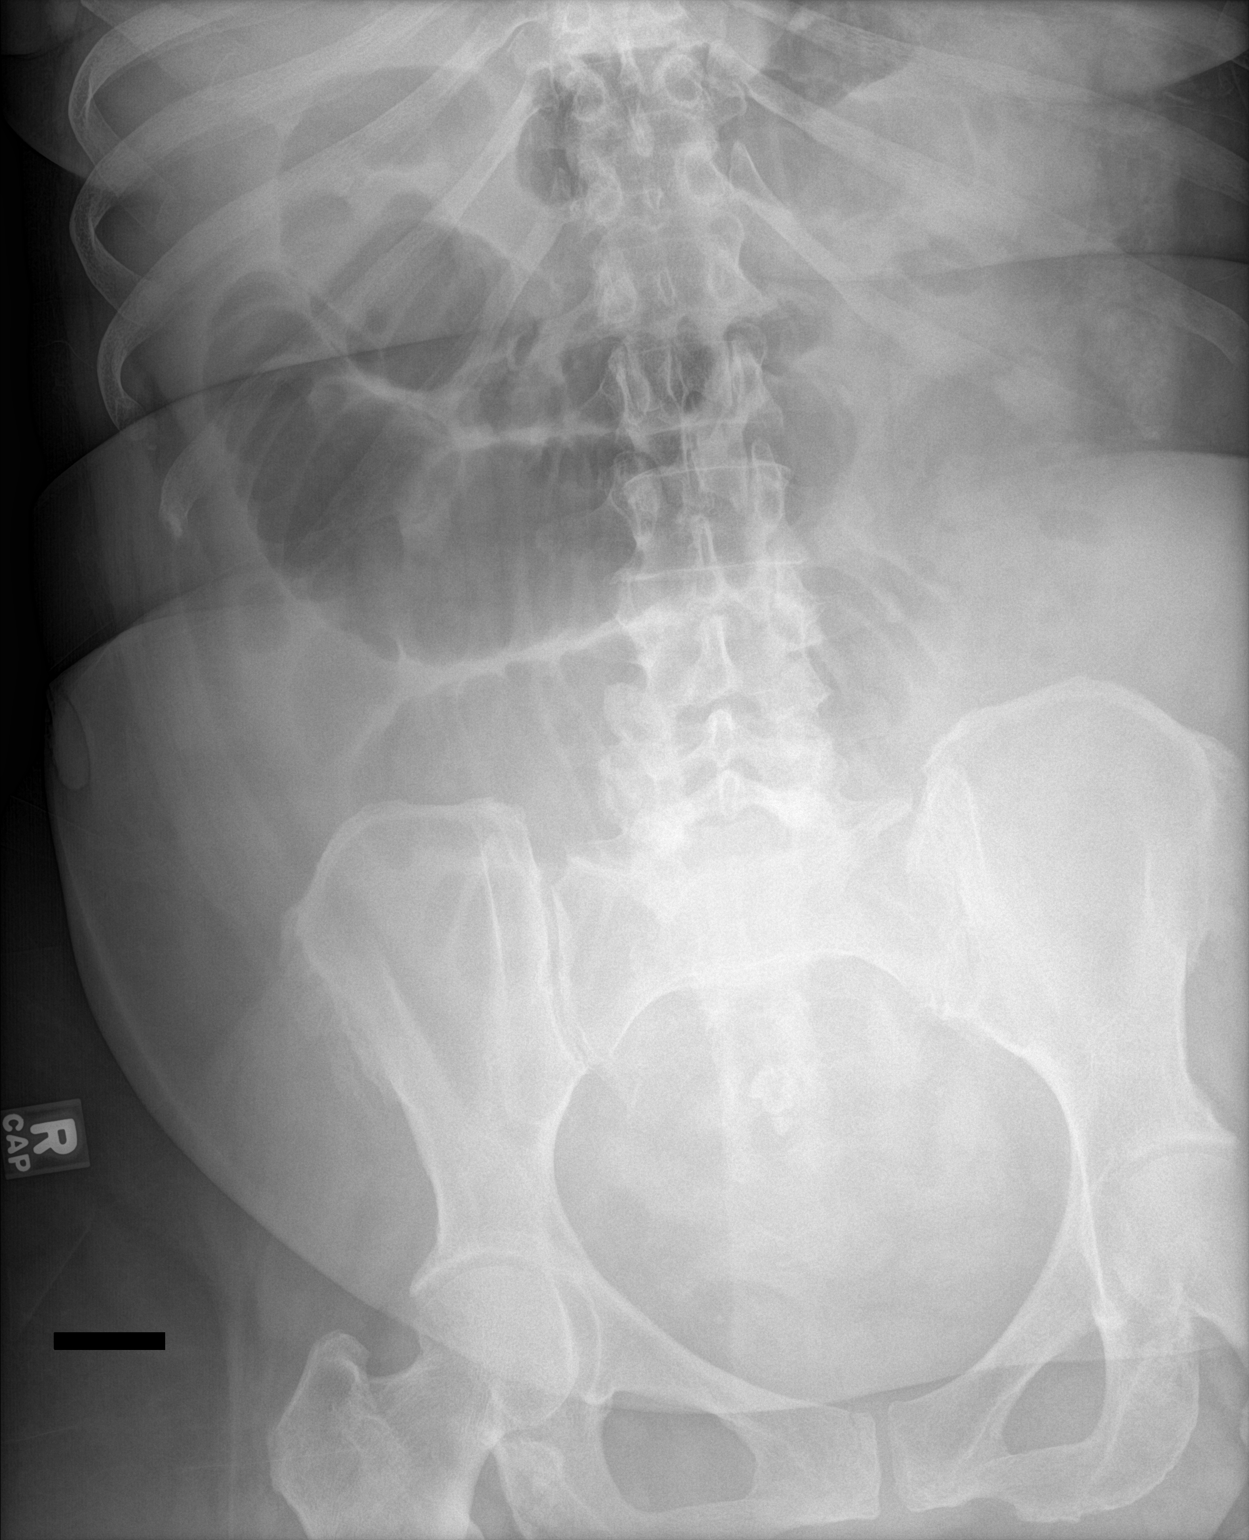

[1 of 1 positions shown; findings below may reference images not displayed]

FINDINGS: Continued prominent small bowel distension with loops measuring up
to 5.7 cm diameter. The degree of distention has increased. No
concerning mass effect or gas collection.
IMPRESSION: Small bowel obstruction with progressive distention.

## 2020-04-05 DIAGNOSIS — Z20822 Contact with and (suspected) exposure to covid-19: Secondary | ICD-10-CM | POA: Diagnosis not present

## 2020-04-21 DIAGNOSIS — Z1231 Encounter for screening mammogram for malignant neoplasm of breast: Secondary | ICD-10-CM | POA: Diagnosis not present

## 2020-04-21 DIAGNOSIS — Z6837 Body mass index (BMI) 37.0-37.9, adult: Secondary | ICD-10-CM | POA: Diagnosis not present

## 2020-04-21 DIAGNOSIS — Z01419 Encounter for gynecological examination (general) (routine) without abnormal findings: Secondary | ICD-10-CM | POA: Diagnosis not present

## 2020-05-17 DIAGNOSIS — Z23 Encounter for immunization: Secondary | ICD-10-CM | POA: Diagnosis not present

## 2020-06-15 DIAGNOSIS — M5459 Other low back pain: Secondary | ICD-10-CM | POA: Diagnosis not present

## 2020-06-15 DIAGNOSIS — L309 Dermatitis, unspecified: Secondary | ICD-10-CM | POA: Diagnosis not present

## 2020-06-15 DIAGNOSIS — E559 Vitamin D deficiency, unspecified: Secondary | ICD-10-CM | POA: Diagnosis not present

## 2020-09-25 DIAGNOSIS — Z20822 Contact with and (suspected) exposure to covid-19: Secondary | ICD-10-CM | POA: Diagnosis not present

## 2020-10-04 DIAGNOSIS — Z20822 Contact with and (suspected) exposure to covid-19: Secondary | ICD-10-CM | POA: Diagnosis not present

## 2020-10-04 DIAGNOSIS — R519 Headache, unspecified: Secondary | ICD-10-CM | POA: Diagnosis not present

## 2020-10-04 DIAGNOSIS — R0981 Nasal congestion: Secondary | ICD-10-CM | POA: Diagnosis not present

## 2020-10-25 DIAGNOSIS — L918 Other hypertrophic disorders of the skin: Secondary | ICD-10-CM | POA: Diagnosis not present

## 2020-10-25 DIAGNOSIS — E559 Vitamin D deficiency, unspecified: Secondary | ICD-10-CM | POA: Diagnosis not present

## 2020-10-25 DIAGNOSIS — L309 Dermatitis, unspecified: Secondary | ICD-10-CM | POA: Diagnosis not present

## 2020-11-08 DIAGNOSIS — M545 Low back pain, unspecified: Secondary | ICD-10-CM | POA: Diagnosis not present

## 2020-11-08 DIAGNOSIS — M533 Sacrococcygeal disorders, not elsewhere classified: Secondary | ICD-10-CM | POA: Diagnosis not present

## 2020-11-15 DIAGNOSIS — R208 Other disturbances of skin sensation: Secondary | ICD-10-CM | POA: Diagnosis not present

## 2020-11-15 DIAGNOSIS — L918 Other hypertrophic disorders of the skin: Secondary | ICD-10-CM | POA: Diagnosis not present

## 2020-11-18 DIAGNOSIS — R6883 Chills (without fever): Secondary | ICD-10-CM | POA: Diagnosis not present

## 2020-11-18 DIAGNOSIS — J0110 Acute frontal sinusitis, unspecified: Secondary | ICD-10-CM | POA: Diagnosis not present

## 2021-01-01 DIAGNOSIS — J329 Chronic sinusitis, unspecified: Secondary | ICD-10-CM | POA: Diagnosis not present

## 2021-01-01 DIAGNOSIS — J40 Bronchitis, not specified as acute or chronic: Secondary | ICD-10-CM | POA: Diagnosis not present

## 2021-01-01 DIAGNOSIS — R059 Cough, unspecified: Secondary | ICD-10-CM | POA: Diagnosis not present

## 2021-01-05 DIAGNOSIS — B373 Candidiasis of vulva and vagina: Secondary | ICD-10-CM | POA: Diagnosis not present

## 2021-01-05 DIAGNOSIS — N898 Other specified noninflammatory disorders of vagina: Secondary | ICD-10-CM | POA: Diagnosis not present

## 2021-01-05 DIAGNOSIS — R309 Painful micturition, unspecified: Secondary | ICD-10-CM | POA: Diagnosis not present

## 2021-03-08 DIAGNOSIS — Z86010 Personal history of colonic polyps: Secondary | ICD-10-CM | POA: Diagnosis not present

## 2021-03-08 DIAGNOSIS — K56609 Unspecified intestinal obstruction, unspecified as to partial versus complete obstruction: Secondary | ICD-10-CM | POA: Diagnosis not present

## 2021-03-09 DIAGNOSIS — Z20822 Contact with and (suspected) exposure to covid-19: Secondary | ICD-10-CM | POA: Diagnosis not present

## 2021-03-12 DIAGNOSIS — Z20822 Contact with and (suspected) exposure to covid-19: Secondary | ICD-10-CM | POA: Diagnosis not present

## 2021-03-14 DIAGNOSIS — R5383 Other fatigue: Secondary | ICD-10-CM | POA: Diagnosis not present

## 2021-03-14 DIAGNOSIS — J40 Bronchitis, not specified as acute or chronic: Secondary | ICD-10-CM | POA: Diagnosis not present

## 2021-03-14 DIAGNOSIS — R059 Cough, unspecified: Secondary | ICD-10-CM | POA: Diagnosis not present

## 2021-03-14 DIAGNOSIS — J019 Acute sinusitis, unspecified: Secondary | ICD-10-CM | POA: Diagnosis not present

## 2021-04-07 DIAGNOSIS — K5730 Diverticulosis of large intestine without perforation or abscess without bleeding: Secondary | ICD-10-CM | POA: Diagnosis not present

## 2021-04-07 DIAGNOSIS — Z86010 Personal history of colonic polyps: Secondary | ICD-10-CM | POA: Diagnosis not present

## 2021-04-10 DIAGNOSIS — Z0000 Encounter for general adult medical examination without abnormal findings: Secondary | ICD-10-CM | POA: Diagnosis not present

## 2021-04-10 DIAGNOSIS — E781 Pure hyperglyceridemia: Secondary | ICD-10-CM | POA: Diagnosis not present

## 2021-04-10 DIAGNOSIS — M109 Gout, unspecified: Secondary | ICD-10-CM | POA: Diagnosis not present

## 2021-04-10 DIAGNOSIS — Z79899 Other long term (current) drug therapy: Secondary | ICD-10-CM | POA: Diagnosis not present

## 2021-04-10 DIAGNOSIS — I10 Essential (primary) hypertension: Secondary | ICD-10-CM | POA: Diagnosis not present

## 2021-04-25 DIAGNOSIS — Z1231 Encounter for screening mammogram for malignant neoplasm of breast: Secondary | ICD-10-CM | POA: Diagnosis not present

## 2021-04-25 DIAGNOSIS — Z01419 Encounter for gynecological examination (general) (routine) without abnormal findings: Secondary | ICD-10-CM | POA: Diagnosis not present

## 2021-04-25 DIAGNOSIS — Z6837 Body mass index (BMI) 37.0-37.9, adult: Secondary | ICD-10-CM | POA: Diagnosis not present

## 2021-04-25 DIAGNOSIS — M8580 Other specified disorders of bone density and structure, unspecified site: Secondary | ICD-10-CM | POA: Diagnosis not present

## 2021-06-13 ENCOUNTER — Ambulatory Visit
Admission: EM | Admit: 2021-06-13 | Discharge: 2021-06-13 | Disposition: A | Discharge status: Home / Self Care | Payer: BC Managed Care – PPO

## 2021-06-13 ENCOUNTER — Other Ambulatory Visit: Payer: Self-pay

## 2021-06-13 DIAGNOSIS — J069 Acute upper respiratory infection, unspecified: Secondary | ICD-10-CM | POA: Diagnosis not present

## 2021-06-13 DIAGNOSIS — R053 Chronic cough: Secondary | ICD-10-CM | POA: Diagnosis not present

## 2021-06-13 MED ORDER — AZITHROMYCIN 500 MG PO TABS: ORAL_TABLET | ORAL | 0 refills | Status: AC

## 2021-06-13 MED ORDER — PREDNISONE 20 MG PO TABS: 40.0000 mg | ORAL_TABLET | Freq: Every day | ORAL | 0 refills | Status: AC

## 2021-06-13 MED ORDER — PROMETHAZINE-DM 6.25-15 MG/5ML PO SYRP: 5.0000 mL | ORAL_SOLUTION | Freq: Four times a day (QID) | ORAL | 0 refills | Status: AC | PRN

## 2021-06-13 NOTE — ED Triage Notes (Signed)
Two week h/o cough, congestion and sore throat. Has been taking mucinex and zyrtec w/o relief. Confirms diarrhea. No emesis. Pt is covid vaccinated.

## 2021-06-13 NOTE — ED Provider Notes (Signed)
[name]    CSN: [id number] Arrival date & time: 06/13/21  1521      History   Chief Complaint Chief Complaint  Patient presents with   Cough   Nasal Congestion    HPI Danielle Griffin is a 60 y.o. female.   Patient presents with 2-week history of cough, nasal congestion, sore throat.  Cough is productive with yellow sputum per patient.  Patient denies any fevers.  Grandchildren had similar symptoms recently.  Patient denies chest pain or shortness of breath.  Denies any history of chronic lung diseases.  Has been taking over-the-counter cough medications with no improvement in symptoms.   Cough  Past Medical History:  Diagnosis Date   Abnormal heart rhythm    Allergic rhinitis    Anemia    Cystitis    Gout    Hypertension    Leukopenia    Menorrhagia    Migraine    Numbness and tingling    Obesity    Vision abnormalities     Patient Active Problem List   Diagnosis Date Noted   Normocytic anemia 12/31/2018   Hyperglycemia 12/31/2018   Obesity (BMI 30-39.9) 12/31/2018   Abdominal pain 12/25/2018   Hypokalemia 12/25/2018   AKI (acute kidney injury) ([place]) 12/25/2018   Essential hypertension 10/01/2013   Allergic rhinitis, cause unspecified 10/01/2013    Past Surgical History:  Procedure Laterality Date   CARPAL TUNNEL RELEASE     LAPAROTOMY N/A 12/30/2018   Procedure: EXPLORATORY LAPAROTOMY, LYSIS OF ADHESIONS;  Surgeon: Coralie Keens, MD;  Location: [facility];  Service: General;  Laterality: N/A;   ROBOTIC ASSISTED LAPAROSCOPIC VAGINAL HYSTERECTOMY WITH FIBROID REMOVAL     TUBAL LIGATION      OB History   No obstetric history on file.      Home Medications    Prior to Admission medications   Medication Sig Start Date End Date Taking? Authorizing Provider  azithromycin (ZITHROMAX) 500 MG tablet Take 1 tablet (500 mg total) by mouth daily for 1 day, THEN 0.5 tablets (250 mg total) daily for 4 days. 06/13/21 06/18/21 Yes Odis Luster, FNP  predniSONE (DELTASONE) 20 MG tablet Take 2 tablets (40 mg total) by mouth daily for 5 days. 06/13/21 06/18/21 Yes Odis Luster, FNP  promethazine-dextromethorphan (PROMETHAZINE-DM) 6.25-15 MG/5ML syrup Take 5 mLs by mouth 4 (four) times daily as needed for cough. 06/13/21  Yes Odis Luster, FNP  allopurinol (ZYLOPRIM) 300 MG tablet Take 300 mg by mouth daily.    [provider]  cetirizine (ZYRTEC) 10 MG chewable tablet Chew 10 mg by mouth daily.    [provider]  dicyclomine (BENTYL) 20 MG tablet Take 1 tablet (20 mg total) by mouth 2 (two) times daily. 12/23/18   Tasia Catchings, Amy V, PA-C  hydrochlorothiazide (HYDRODIURIL) 25 MG tablet Take 25 mg by mouth daily.    [provider]  HYDROcodone-acetaminophen (NORCO) 7.5-325 MG tablet Take 1 tablet by mouth every 4 (four) hours as needed for moderate pain. 01/06/19   Sheikh, Georgina Quint Latif, DO  lip balm (CARMEX) ointment Apply 1 application topically 2 (two) times daily. 01/06/19   Raiford Noble Latif, DO  Multiple Vitamin (MULTIVITAMIN) tablet Take 1 tablet by mouth daily.    [provider]  olmesartan (BENICAR) 40 MG tablet Take 40 mg by mouth daily.    [provider]  ondansetron (ZOFRAN ODT) 4 MG disintegrating tablet Take 1 tablet (4 mg total) by mouth every 8 (eight)  hours as needed for nausea or vomiting. 12/23/18   Ok Edwards, PA-C  triamterene-hydrochlorothiazide (MAXZIDE-25) 37.5-25 MG tablet Take 1 tablet by mouth daily. 05/26/21   [provider]    Family History Family History  Problem Relation Age of Onset   Hypertension Mother    Hyperlipidemia Mother    Dementia Mother    Esophageal cancer Father    Hyperlipidemia Sister    Hypertension Sister    Hypertension Brother    Hypertension Brother    Colon cancer Maternal Aunt    Heart attack Maternal Grandfather     Social History Social History   Tobacco Use   Smoking status: Never   Smokeless tobacco: Never   Substance Use Topics   Alcohol use: Yes   Drug use: No     Allergies   Norvasc [amlodipine besylate], Penicillins, and Tylox [oxycodone-acetaminophen]   Review of Systems Review of Systems Per HPI  Physical Exam Triage Vital Signs ED Triage Vitals  Enc Vitals Group     BP 06/13/21 1555 123/82     Pulse Rate 06/13/21 1555 77     Resp 06/13/21 1555 18     Temp 06/13/21 1555 97.8 F (36.6 C)     Temp Source 06/13/21 1555 Oral     SpO2 06/13/21 1555 98 %     Weight --      Height --      Head Circumference --      Peak Flow --      Pain Score 06/13/21 1557 0     Pain Loc --      Pain Edu? --      Excl. in [place]? --    No data found.  Updated Vital Signs BP 123/82 (BP Location: Left Arm)   Pulse 77   Temp 97.8 F (36.6 C) (Oral)   Resp 18   SpO2 98%   Visual Acuity Right Eye Distance:   Left Eye Distance:   Bilateral Distance:    Right Eye Near:   Left Eye Near:    Bilateral Near:     Physical Exam Constitutional:      General: She is not in acute distress.    Appearance: Normal appearance.  HENT:     Head: Normocephalic and atraumatic.     Right Ear: Tympanic membrane and ear canal normal.     Left Ear: Tympanic membrane and ear canal normal.     Nose: Congestion present.     Mouth/Throat:     Mouth: Mucous membranes are moist.     Pharynx: No posterior oropharyngeal erythema.  Eyes:     Extraocular Movements: Extraocular movements intact.     Conjunctiva/sclera: Conjunctivae normal.     Pupils: Pupils are equal, round, and reactive to light.  Cardiovascular:     Rate and Rhythm: Normal rate and regular rhythm.     Pulses: Normal pulses.     Heart sounds: Normal heart sounds.  Pulmonary:     Effort: Pulmonary effort is normal. No respiratory distress.     Breath sounds: Normal breath sounds. No stridor. No wheezing or rhonchi.  Abdominal:     General: Abdomen is flat. Bowel sounds are normal.     Palpations: Abdomen is soft.  Musculoskeletal:         General: Normal range of motion.     Cervical back: Normal range of motion.  Skin:    General: Skin is warm and dry.  Neurological:  General: No focal deficit present.     Mental Status: She is alert and oriented to person, place, and time. Mental status is at baseline.  Psychiatric:        Mood and Affect: Mood normal.        Behavior: Behavior normal.     UC Treatments / Results  Labs (all labs ordered are listed, but only abnormal results are displayed) Labs Reviewed - No data to display  EKG   Radiology No results found.  Procedures Procedures (including critical care time)  Medications Ordered in UC Medications - No data to display  Initial Impression / Assessment and Plan / UC Course  I have reviewed the triage vital signs and the nursing notes.  Pertinent labs & imaging results that were available during my care of the patient were reviewed by me and considered in my medical decision making (see chart for details).     Suggested chest x-ray to patient but patient declined.  Will prescribe azithromycin antibiotic to cover atypicals especially since patient does not want chest x-ray.  Prednisone steroid prescribed to decrease inflammation in chest.  Patient requesting cough medication.  Will prescribe Promethazine DM.  Advised patient this medication can cause drowsiness.  Do not think strep testing is necessary due to appearance of posterior pharynx on physical exam.  Do not think COVID-19 testing is necessary either due to duration of symptoms.  Discussed strict return precautions. Patient verbalized understanding and is agreeable with plan.  Final Clinical Impressions(s) / UC Diagnoses   Final diagnoses:  Acute upper respiratory infection  Persistent cough     Discharge Instructions      You have been prescribed 3 medications to help with your symptoms.  Please be advised that cough medication can cause drowsiness.     ED Prescriptions      Medication Sig Dispense Auth. Provider   azithromycin (ZITHROMAX) 500 MG tablet Take 1 tablet (500 mg total) by mouth daily for 1 day, THEN 0.5 tablets (250 mg total) daily for 4 days. 3 tablet Odis Luster, FNP   predniSONE (DELTASONE) 20 MG tablet Take 2 tablets (40 mg total) by mouth daily for 5 days. 10 tablet Odis Luster, FNP   promethazine-dextromethorphan (PROMETHAZINE-DM) 6.25-15 MG/5ML syrup Take 5 mLs by mouth 4 (four) times daily as needed for cough. 118 mL Odis Luster, FNP      I have reviewed the PDMP during this encounter.   Odis Luster, [place] 06/13/21 [phone]

## 2021-06-13 NOTE — Discharge Instructions (Addendum)
You have been prescribed 3 medications to help with your symptoms.  Please be advised that cough medication can cause drowsiness.

## 2021-11-01 DIAGNOSIS — R059 Cough, unspecified: Secondary | ICD-10-CM | POA: Diagnosis not present

## 2021-11-01 DIAGNOSIS — J019 Acute sinusitis, unspecified: Secondary | ICD-10-CM | POA: Diagnosis not present

## 2021-11-01 DIAGNOSIS — R0981 Nasal congestion: Secondary | ICD-10-CM | POA: Diagnosis not present

## 2021-12-05 DIAGNOSIS — H43813 Vitreous degeneration, bilateral: Secondary | ICD-10-CM | POA: Diagnosis not present

## 2021-12-20 DIAGNOSIS — L239 Allergic contact dermatitis, unspecified cause: Secondary | ICD-10-CM | POA: Diagnosis not present

## 2021-12-26 DIAGNOSIS — H43813 Vitreous degeneration, bilateral: Secondary | ICD-10-CM | POA: Diagnosis not present

## 2022-01-03 DIAGNOSIS — M533 Sacrococcygeal disorders, not elsewhere classified: Secondary | ICD-10-CM | POA: Diagnosis not present

## 2022-01-15 DIAGNOSIS — M47816 Spondylosis without myelopathy or radiculopathy, lumbar region: Secondary | ICD-10-CM | POA: Diagnosis not present

## 2022-01-15 DIAGNOSIS — M533 Sacrococcygeal disorders, not elsewhere classified: Secondary | ICD-10-CM | POA: Diagnosis not present

## 2022-01-31 DIAGNOSIS — L239 Allergic contact dermatitis, unspecified cause: Secondary | ICD-10-CM | POA: Diagnosis not present

## 2022-02-06 DIAGNOSIS — R309 Painful micturition, unspecified: Secondary | ICD-10-CM | POA: Diagnosis not present

## 2022-02-06 DIAGNOSIS — M533 Sacrococcygeal disorders, not elsewhere classified: Secondary | ICD-10-CM | POA: Diagnosis not present

## 2022-02-08 DIAGNOSIS — L309 Dermatitis, unspecified: Secondary | ICD-10-CM | POA: Diagnosis not present

## 2022-02-08 DIAGNOSIS — L989 Disorder of the skin and subcutaneous tissue, unspecified: Secondary | ICD-10-CM | POA: Diagnosis not present

## 2022-02-15 DIAGNOSIS — L810 Postinflammatory hyperpigmentation: Secondary | ICD-10-CM | POA: Diagnosis not present

## 2022-03-05 DIAGNOSIS — M47816 Spondylosis without myelopathy or radiculopathy, lumbar region: Secondary | ICD-10-CM | POA: Diagnosis not present

## 2022-05-04 DIAGNOSIS — Z6837 Body mass index (BMI) 37.0-37.9, adult: Secondary | ICD-10-CM | POA: Diagnosis not present

## 2022-05-04 DIAGNOSIS — Z1231 Encounter for screening mammogram for malignant neoplasm of breast: Secondary | ICD-10-CM | POA: Diagnosis not present

## 2022-05-04 DIAGNOSIS — Z01419 Encounter for gynecological examination (general) (routine) without abnormal findings: Secondary | ICD-10-CM | POA: Diagnosis not present

## 2022-05-04 DIAGNOSIS — Z0142 Encounter for cervical smear to confirm findings of recent normal smear following initial abnormal smear: Secondary | ICD-10-CM | POA: Diagnosis not present

## 2022-05-04 DIAGNOSIS — Z124 Encounter for screening for malignant neoplasm of cervix: Secondary | ICD-10-CM | POA: Diagnosis not present

## 2022-05-04 DIAGNOSIS — Z01411 Encounter for gynecological examination (general) (routine) with abnormal findings: Secondary | ICD-10-CM | POA: Diagnosis not present

## 2022-06-11 DIAGNOSIS — L810 Postinflammatory hyperpigmentation: Secondary | ICD-10-CM | POA: Diagnosis not present

## 2022-07-31 DIAGNOSIS — E781 Pure hyperglyceridemia: Secondary | ICD-10-CM | POA: Diagnosis not present

## 2022-07-31 DIAGNOSIS — E559 Vitamin D deficiency, unspecified: Secondary | ICD-10-CM | POA: Diagnosis not present

## 2022-07-31 DIAGNOSIS — Z0000 Encounter for general adult medical examination without abnormal findings: Secondary | ICD-10-CM | POA: Diagnosis not present

## 2022-07-31 DIAGNOSIS — Z23 Encounter for immunization: Secondary | ICD-10-CM | POA: Diagnosis not present

## 2022-07-31 DIAGNOSIS — I10 Essential (primary) hypertension: Secondary | ICD-10-CM | POA: Diagnosis not present

## 2022-07-31 DIAGNOSIS — M109 Gout, unspecified: Secondary | ICD-10-CM | POA: Diagnosis not present

## 2022-07-31 DIAGNOSIS — Z79899 Other long term (current) drug therapy: Secondary | ICD-10-CM | POA: Diagnosis not present

## 2022-08-03 DIAGNOSIS — M5416 Radiculopathy, lumbar region: Secondary | ICD-10-CM | POA: Diagnosis not present

## 2022-08-14 DIAGNOSIS — M5416 Radiculopathy, lumbar region: Secondary | ICD-10-CM | POA: Diagnosis not present

## 2022-08-24 DIAGNOSIS — R051 Acute cough: Secondary | ICD-10-CM | POA: Diagnosis not present

## 2022-08-24 DIAGNOSIS — J069 Acute upper respiratory infection, unspecified: Secondary | ICD-10-CM | POA: Diagnosis not present

## 2022-08-24 DIAGNOSIS — Z03818 Encounter for observation for suspected exposure to other biological agents ruled out: Secondary | ICD-10-CM | POA: Diagnosis not present

## 2022-08-24 DIAGNOSIS — M5416 Radiculopathy, lumbar region: Secondary | ICD-10-CM | POA: Diagnosis not present

## 2022-09-10 DIAGNOSIS — M47816 Spondylosis without myelopathy or radiculopathy, lumbar region: Secondary | ICD-10-CM | POA: Diagnosis not present

## 2022-11-22 DIAGNOSIS — E559 Vitamin D deficiency, unspecified: Secondary | ICD-10-CM | POA: Diagnosis not present

## 2022-12-25 DIAGNOSIS — N951 Menopausal and female climacteric states: Secondary | ICD-10-CM | POA: Diagnosis not present

## 2022-12-25 DIAGNOSIS — Z13820 Encounter for screening for osteoporosis: Secondary | ICD-10-CM | POA: Diagnosis not present

## 2023-03-05 DIAGNOSIS — M5416 Radiculopathy, lumbar region: Secondary | ICD-10-CM | POA: Diagnosis not present

## 2023-03-05 DIAGNOSIS — M25511 Pain in right shoulder: Secondary | ICD-10-CM | POA: Diagnosis not present

## 2023-03-14 DIAGNOSIS — L814 Other melanin hyperpigmentation: Secondary | ICD-10-CM | POA: Diagnosis not present

## 2023-03-22 ENCOUNTER — Other Ambulatory Visit: Payer: Self-pay | Admitting: Physical Medicine and Rehabilitation

## 2023-03-22 DIAGNOSIS — M545 Low back pain, unspecified: Secondary | ICD-10-CM

## 2023-05-07 DIAGNOSIS — Z01419 Encounter for gynecological examination (general) (routine) without abnormal findings: Secondary | ICD-10-CM | POA: Diagnosis not present

## 2023-05-07 DIAGNOSIS — Z1331 Encounter for screening for depression: Secondary | ICD-10-CM | POA: Diagnosis not present

## 2023-05-07 DIAGNOSIS — Z1231 Encounter for screening mammogram for malignant neoplasm of breast: Secondary | ICD-10-CM | POA: Diagnosis not present

## 2023-05-24 DIAGNOSIS — J029 Acute pharyngitis, unspecified: Secondary | ICD-10-CM | POA: Diagnosis not present

## 2023-05-24 DIAGNOSIS — M549 Dorsalgia, unspecified: Secondary | ICD-10-CM | POA: Diagnosis not present

## 2023-08-08 DIAGNOSIS — R7309 Other abnormal glucose: Secondary | ICD-10-CM | POA: Diagnosis not present

## 2023-08-08 DIAGNOSIS — M109 Gout, unspecified: Secondary | ICD-10-CM | POA: Diagnosis not present

## 2023-08-08 DIAGNOSIS — E559 Vitamin D deficiency, unspecified: Secondary | ICD-10-CM | POA: Diagnosis not present

## 2023-08-08 DIAGNOSIS — Z79899 Other long term (current) drug therapy: Secondary | ICD-10-CM | POA: Diagnosis not present

## 2023-08-08 DIAGNOSIS — D649 Anemia, unspecified: Secondary | ICD-10-CM | POA: Diagnosis not present

## 2023-08-08 DIAGNOSIS — E781 Pure hyperglyceridemia: Secondary | ICD-10-CM | POA: Diagnosis not present

## 2023-08-08 DIAGNOSIS — Z0000 Encounter for general adult medical examination without abnormal findings: Secondary | ICD-10-CM | POA: Diagnosis not present

## 2023-08-12 DIAGNOSIS — M47816 Spondylosis without myelopathy or radiculopathy, lumbar region: Secondary | ICD-10-CM | POA: Diagnosis not present

## 2023-09-01 DIAGNOSIS — J988 Other specified respiratory disorders: Secondary | ICD-10-CM | POA: Diagnosis not present

## 2023-10-14 DIAGNOSIS — M722 Plantar fascial fibromatosis: Secondary | ICD-10-CM | POA: Diagnosis not present

## 2023-11-04 DIAGNOSIS — L814 Other melanin hyperpigmentation: Secondary | ICD-10-CM | POA: Diagnosis not present

## 2023-11-07 DIAGNOSIS — M67912 Unspecified disorder of synovium and tendon, left shoulder: Secondary | ICD-10-CM | POA: Diagnosis not present

## 2023-11-07 DIAGNOSIS — M722 Plantar fascial fibromatosis: Secondary | ICD-10-CM | POA: Diagnosis not present

## 2024-01-06 DIAGNOSIS — M79602 Pain in left arm: Secondary | ICD-10-CM | POA: Diagnosis not present

## 2024-01-06 DIAGNOSIS — M7712 Lateral epicondylitis, left elbow: Secondary | ICD-10-CM | POA: Diagnosis not present

## 2024-06-04 DIAGNOSIS — Z1231 Encounter for screening mammogram for malignant neoplasm of breast: Secondary | ICD-10-CM | POA: Diagnosis not present

## 2024-06-04 DIAGNOSIS — Z01419 Encounter for gynecological examination (general) (routine) without abnormal findings: Secondary | ICD-10-CM | POA: Diagnosis not present

## 2024-06-09 ENCOUNTER — Other Ambulatory Visit: Payer: Self-pay | Admitting: Obstetrics & Gynecology

## 2024-06-09 DIAGNOSIS — R928 Other abnormal and inconclusive findings on diagnostic imaging of breast: Secondary | ICD-10-CM

## 2024-06-16 ENCOUNTER — Ambulatory Visit
Admission: RE | Admit: 2024-06-16 | Discharge: 2024-06-16 | Disposition: A | Discharge status: Home / Self Care | Source: Ambulatory Visit | Attending: Obstetrics & Gynecology | Admitting: Obstetrics & Gynecology

## 2024-06-16 DIAGNOSIS — M47816 Spondylosis without myelopathy or radiculopathy, lumbar region: Secondary | ICD-10-CM | POA: Diagnosis not present

## 2024-06-16 DIAGNOSIS — M5416 Radiculopathy, lumbar region: Secondary | ICD-10-CM | POA: Diagnosis not present

## 2024-06-16 DIAGNOSIS — R928 Other abnormal and inconclusive findings on diagnostic imaging of breast: Secondary | ICD-10-CM

## 2024-06-16 DIAGNOSIS — R921 Mammographic calcification found on diagnostic imaging of breast: Secondary | ICD-10-CM | POA: Diagnosis not present

## 2024-06-17 ENCOUNTER — Encounter: Payer: Self-pay | Admitting: Obstetrics & Gynecology

## 2024-06-17 ENCOUNTER — Other Ambulatory Visit: Payer: Self-pay | Admitting: Obstetrics & Gynecology

## 2024-06-17 DIAGNOSIS — R928 Other abnormal and inconclusive findings on diagnostic imaging of breast: Secondary | ICD-10-CM

## 2024-07-16 DIAGNOSIS — M5416 Radiculopathy, lumbar region: Secondary | ICD-10-CM | POA: Diagnosis not present

## 2024-07-27 DIAGNOSIS — L432 Lichenoid drug reaction: Secondary | ICD-10-CM | POA: Diagnosis not present

## 2024-07-28 DIAGNOSIS — M170 Bilateral primary osteoarthritis of knee: Secondary | ICD-10-CM | POA: Diagnosis not present

## 2024-07-28 DIAGNOSIS — M1712 Unilateral primary osteoarthritis, left knee: Secondary | ICD-10-CM | POA: Diagnosis not present

## 2024-08-04 DIAGNOSIS — M109 Gout, unspecified: Secondary | ICD-10-CM | POA: Diagnosis not present

## 2024-08-04 DIAGNOSIS — I10 Essential (primary) hypertension: Secondary | ICD-10-CM | POA: Diagnosis not present

## 2024-08-12 DIAGNOSIS — M109 Gout, unspecified: Secondary | ICD-10-CM | POA: Diagnosis not present

## 2024-08-12 DIAGNOSIS — I10 Essential (primary) hypertension: Secondary | ICD-10-CM | POA: Diagnosis not present

## 2024-08-12 DIAGNOSIS — Z0000 Encounter for general adult medical examination without abnormal findings: Secondary | ICD-10-CM | POA: Diagnosis not present

## 2024-08-12 DIAGNOSIS — M549 Dorsalgia, unspecified: Secondary | ICD-10-CM | POA: Diagnosis not present

## 2024-12-16 ENCOUNTER — Encounter
